# Patient Record
Sex: Male | Born: 1975 | Race: White | Hispanic: No | Marital: Married | Smoking: Never smoker
Health system: Southern US, Community
[De-identification: ages and names within clinical notes are randomized; demographics above are authoritative.]

## PROBLEM LIST (undated history)

## (undated) DIAGNOSIS — R112 Nausea with vomiting, unspecified: Secondary | ICD-10-CM

## (undated) DIAGNOSIS — K219 Gastro-esophageal reflux disease without esophagitis: Secondary | ICD-10-CM

## (undated) DIAGNOSIS — I341 Nonrheumatic mitral (valve) prolapse: Secondary | ICD-10-CM

## (undated) DIAGNOSIS — N201 Calculus of ureter: Secondary | ICD-10-CM

## (undated) DIAGNOSIS — Z973 Presence of spectacles and contact lenses: Secondary | ICD-10-CM

## (undated) DIAGNOSIS — Z87442 Personal history of urinary calculi: Secondary | ICD-10-CM

## (undated) DIAGNOSIS — Z9889 Other specified postprocedural states: Secondary | ICD-10-CM

## (undated) HISTORY — PX: URETEROSCOPY WITH HOLMIUM LASER LITHOTRIPSY: SHX6645

## (undated) HISTORY — PX: OTHER SURGICAL HISTORY: SHX169

## (undated) HISTORY — PX: EXTRACORPOREAL SHOCK WAVE LITHOTRIPSY: SHX1557

## (undated) HISTORY — PX: ORIF FOOT FRACTURE: SHX2123

## (undated) HISTORY — PX: CYSTOSCOPY: SUR368

---

## 1996-08-30 HISTORY — PX: KNEE ARTHROSCOPY W/ MENISCAL REPAIR: SHX1877

## 2019-07-31 HISTORY — PX: EXTRACORPOREAL SHOCK WAVE LITHOTRIPSY: SHX1557

## 2019-11-16 ENCOUNTER — Other Ambulatory Visit: Payer: Self-pay

## 2019-11-16 ENCOUNTER — Encounter (HOSPITAL_BASED_OUTPATIENT_CLINIC_OR_DEPARTMENT_OTHER): Payer: Self-pay | Admitting: Urology

## 2019-11-16 ENCOUNTER — Other Ambulatory Visit: Payer: Self-pay | Admitting: Urology

## 2019-11-16 NOTE — Progress Notes (Signed)
Spoke w/ via phone for pre-op interview---[facility] needs dos----  none           COVID test ------11-17-2019 900 Arrive at -------900 am 11-21-2019 NPO after ------midnight Medications to take morning of surgery -----tamsulosin Diabetic medication -----n/a Patient Special Instructions -----none Pre-Op special Istructions -----none Patient verbalized understanding of instructions that were given at this phone interview. Patient denies shortness of breath, chest pain, fever, cough a this phone interview.

## 2019-11-17 ENCOUNTER — Other Ambulatory Visit (HOSPITAL_COMMUNITY)
Admission: RE | Admit: 2019-11-17 | Discharge: 2019-11-17 | Disposition: A | Discharge status: Home / Self Care | Payer: BC Managed Care – PPO | Source: Ambulatory Visit | Attending: Urology | Admitting: Urology

## 2019-11-17 DIAGNOSIS — Z20822 Contact with and (suspected) exposure to covid-19: Secondary | ICD-10-CM | POA: Insufficient documentation

## 2019-11-17 DIAGNOSIS — Z01812 Encounter for preprocedural laboratory examination: Secondary | ICD-10-CM | POA: Diagnosis not present

## 2019-11-17 LAB — SARS CORONAVIRUS 2 (TAT 6-24 HRS): SARS Coronavirus 2: NEGATIVE

## 2019-11-21 ENCOUNTER — Ambulatory Visit (HOSPITAL_BASED_OUTPATIENT_CLINIC_OR_DEPARTMENT_OTHER)
Admission: RE | Admit: 2019-11-21 | Discharge: 2019-11-21 | Disposition: A | Discharge status: Home / Self Care | Payer: BC Managed Care – PPO | Attending: Urology | Admitting: Urology

## 2019-11-21 ENCOUNTER — Encounter (HOSPITAL_BASED_OUTPATIENT_CLINIC_OR_DEPARTMENT_OTHER)
Admission: RE | Disposition: A | Discharge status: Home / Self Care | Payer: Self-pay | Source: Home / Self Care | Attending: Urology

## 2019-11-21 ENCOUNTER — Ambulatory Visit (HOSPITAL_BASED_OUTPATIENT_CLINIC_OR_DEPARTMENT_OTHER): Payer: BC Managed Care – PPO | Admitting: Certified Registered"

## 2019-11-21 ENCOUNTER — Encounter (HOSPITAL_BASED_OUTPATIENT_CLINIC_OR_DEPARTMENT_OTHER): Payer: Self-pay | Admitting: Urology

## 2019-11-21 DIAGNOSIS — N202 Calculus of kidney with calculus of ureter: Secondary | ICD-10-CM | POA: Diagnosis not present

## 2019-11-21 DIAGNOSIS — Q6210 Congenital occlusion of ureter, unspecified: Secondary | ICD-10-CM | POA: Diagnosis not present

## 2019-11-21 DIAGNOSIS — Z87442 Personal history of urinary calculi: Secondary | ICD-10-CM | POA: Insufficient documentation

## 2019-11-21 HISTORY — DX: Other specified postprocedural states: Z98.890

## 2019-11-21 HISTORY — DX: Personal history of urinary calculi: Z87.442

## 2019-11-21 HISTORY — DX: Nausea with vomiting, unspecified: R11.2

## 2019-11-21 HISTORY — DX: Nonrheumatic mitral (valve) prolapse: I34.1

## 2019-11-21 HISTORY — DX: Gastro-esophageal reflux disease without esophagitis: K21.9

## 2019-11-21 HISTORY — PX: CYSTOSCOPY WITH RETROGRADE PYELOGRAM, URETEROSCOPY AND STENT PLACEMENT: SHX5789

## 2019-11-21 SURGERY — CYSTOURETEROSCOPY, WITH RETROGRADE PYELOGRAM AND STENT INSERTION
Anesthesia: General | Site: Pelvis | Laterality: Bilateral

## 2019-11-21 MED ORDER — DEXAMETHASONE SODIUM PHOSPHATE 10 MG/ML IJ SOLN
INTRAMUSCULAR | Status: DC | PRN
  Administered 2019-11-21 (×2): 5 mg via INTRAVENOUS

## 2019-11-21 MED ORDER — OXYBUTYNIN CHLORIDE 5 MG PO TABS: 5.0000 mg | ORAL_TABLET | Freq: Three times a day (TID) | ORAL | 1 refills | Status: DC | PRN

## 2019-11-21 MED ORDER — PROPOFOL 10 MG/ML IV BOLUS
INTRAVENOUS | Status: AC
  Filled 2019-11-21: qty 40

## 2019-11-21 MED ORDER — OXYCODONE HCL 5 MG/5ML PO SOLN
5.0000 mg | Freq: Once | ORAL | Status: AC | PRN
  Filled 2019-11-21: qty 5

## 2019-11-21 MED ORDER — CEFAZOLIN SODIUM-DEXTROSE 2-4 GM/100ML-% IV SOLN
2.0000 g | INTRAVENOUS | Status: AC
  Administered 2019-11-21: 2 g via INTRAVENOUS
  Filled 2019-11-21: qty 100

## 2019-11-21 MED ORDER — IOHEXOL 300 MG/ML  SOLN
INTRAMUSCULAR | Status: DC | PRN
  Administered 2019-11-21: 40 mL via URETHRAL

## 2019-11-21 MED ORDER — MIDAZOLAM HCL 2 MG/2ML IJ SOLN
INTRAMUSCULAR | Status: DC | PRN
  Administered 2019-11-21: 2 mg via INTRAVENOUS

## 2019-11-21 MED ORDER — KETOROLAC TROMETHAMINE 30 MG/ML IJ SOLN
INTRAMUSCULAR | Status: DC | PRN
  Administered 2019-11-21: 30 mg via INTRAVENOUS

## 2019-11-21 MED ORDER — LIDOCAINE 2% (20 MG/ML) 5 ML SYRINGE
INTRAMUSCULAR | Status: AC
  Filled 2019-11-21: qty 5

## 2019-11-21 MED ORDER — MIDAZOLAM HCL 2 MG/2ML IJ SOLN
INTRAMUSCULAR | Status: AC
  Filled 2019-11-21: qty 2

## 2019-11-21 MED ORDER — SODIUM CHLORIDE 0.9 % IR SOLN
Status: DC | PRN
  Administered 2019-11-21: 3000 mL via INTRAVESICAL

## 2019-11-21 MED ORDER — FENTANYL CITRATE (PF) 100 MCG/2ML IJ SOLN
INTRAMUSCULAR | Status: AC
  Filled 2019-11-21: qty 2

## 2019-11-21 MED ORDER — HYDROCODONE-ACETAMINOPHEN 5-325 MG PO TABS: 1.0000 | ORAL_TABLET | ORAL | 0 refills | Status: DC | PRN

## 2019-11-21 MED ORDER — ONDANSETRON HCL 4 MG/2ML IJ SOLN
4.0000 mg | Freq: Once | INTRAMUSCULAR | Status: DC | PRN
  Filled 2019-11-21: qty 2

## 2019-11-21 MED ORDER — LACTATED RINGERS IV SOLN
INTRAVENOUS | Status: DC
  Filled 2019-11-21: qty 1000

## 2019-11-21 MED ORDER — CEFAZOLIN SODIUM-DEXTROSE 2-4 GM/100ML-% IV SOLN
INTRAVENOUS | Status: AC
  Filled 2019-11-21: qty 100

## 2019-11-21 MED ORDER — OXYCODONE HCL 5 MG PO TABS
5.0000 mg | ORAL_TABLET | Freq: Once | ORAL | Status: AC | PRN
  Administered 2019-11-21: 12:00:00 5 mg via ORAL
  Filled 2019-11-21: qty 1

## 2019-11-21 MED ORDER — DEXAMETHASONE SODIUM PHOSPHATE 10 MG/ML IJ SOLN
INTRAMUSCULAR | Status: AC
  Filled 2019-11-21: qty 1

## 2019-11-21 MED ORDER — ONDANSETRON HCL 4 MG/2ML IJ SOLN
INTRAMUSCULAR | Status: DC | PRN
  Administered 2019-11-21: 4 mg via INTRAVENOUS

## 2019-11-21 MED ORDER — PROPOFOL 10 MG/ML IV BOLUS
INTRAVENOUS | Status: DC | PRN
  Administered 2019-11-21: 200 mg via INTRAVENOUS

## 2019-11-21 MED ORDER — KETOROLAC TROMETHAMINE 30 MG/ML IJ SOLN
INTRAMUSCULAR | Status: AC
  Filled 2019-11-21: qty 1

## 2019-11-21 MED ORDER — FENTANYL CITRATE (PF) 100 MCG/2ML IJ SOLN
25.0000 ug | INTRAMUSCULAR | Status: DC | PRN
  Filled 2019-11-21: qty 1

## 2019-11-21 MED ORDER — PROPOFOL 10 MG/ML IV BOLUS
INTRAVENOUS | Status: AC
  Filled 2019-11-21: qty 20

## 2019-11-21 MED ORDER — FENTANYL CITRATE (PF) 100 MCG/2ML IJ SOLN
INTRAMUSCULAR | Status: DC | PRN
  Administered 2019-11-21: 50 ug via INTRAVENOUS
  Administered 2019-11-21: 25 ug via INTRAVENOUS
  Administered 2019-11-21 (×2): 50 ug via INTRAVENOUS
  Administered 2019-11-21: 25 ug via INTRAVENOUS

## 2019-11-21 MED ORDER — PHENAZOPYRIDINE HCL 200 MG PO TABS: 200.0000 mg | ORAL_TABLET | Freq: Three times a day (TID) | ORAL | 0 refills | Status: DC | PRN

## 2019-11-21 MED ORDER — SCOPOLAMINE 1 MG/3DAYS TD PT72
MEDICATED_PATCH | TRANSDERMAL | Status: DC | PRN
  Administered 2019-11-21: 1 via TRANSDERMAL

## 2019-11-21 MED ORDER — TAMSULOSIN HCL 0.4 MG PO CAPS: 0.4000 mg | ORAL_CAPSULE | Freq: Every day | ORAL | 0 refills | Status: DC

## 2019-11-21 MED ORDER — SCOPOLAMINE 1 MG/3DAYS TD PT72
MEDICATED_PATCH | TRANSDERMAL | Status: AC
  Filled 2019-11-21: qty 1

## 2019-11-21 MED ORDER — LIDOCAINE 2% (20 MG/ML) 5 ML SYRINGE
INTRAMUSCULAR | Status: DC | PRN
  Administered 2019-11-21: 100 mg via INTRAVENOUS

## 2019-11-21 MED ORDER — ONDANSETRON HCL 4 MG/2ML IJ SOLN
INTRAMUSCULAR | Status: AC
  Filled 2019-11-21: qty 2

## 2019-11-21 MED ORDER — OXYCODONE HCL 5 MG PO TABS
ORAL_TABLET | ORAL | Status: AC
  Filled 2019-11-21: qty 1

## 2019-11-21 SURGICAL SUPPLY — 23 items
BAG DRAIN URO-CYSTO SKYTR STRL (DRAIN) ×2 IMPLANT
BASKET STONE 1.7 NGAGE (UROLOGICAL SUPPLIES) ×1 IMPLANT
BASKET ZERO TIP NITINOL 2.4FR (BASKET) ×1 IMPLANT
BENZOIN TINCTURE PRP APPL 2/3 (GAUZE/BANDAGES/DRESSINGS) ×1 IMPLANT
CATH URET 5FR 28IN OPEN ENDED (CATHETERS) ×1 IMPLANT
CLOTH BEACON ORANGE TIMEOUT ST (SAFETY) ×1 IMPLANT
FIBER LASER FLEXIVA 365 (UROLOGICAL SUPPLIES) IMPLANT
FIBER LASER TRAC TIP (UROLOGICAL SUPPLIES) ×1 IMPLANT
GLOVE BIO SURGEON STRL SZ7.5 (GLOVE) ×2 IMPLANT
GOWN STRL REUS W/TWL XL LVL3 (GOWN DISPOSABLE) ×2 IMPLANT
GUIDEWIRE STR DUAL SENSOR (WIRE) ×1 IMPLANT
GUIDEWIRE ZIPWRE .038 STRAIGHT (WIRE) ×2 IMPLANT
IV NS IRRIG 3000ML ARTHROMATIC (IV SOLUTION) ×2 IMPLANT
KIT TURNOVER CYSTO (KITS) ×2 IMPLANT
MANIFOLD NEPTUNE II (INSTRUMENTS) ×2 IMPLANT
NS IRRIG 500ML POUR BTL (IV SOLUTION) ×2 IMPLANT
PACK CYSTO (CUSTOM PROCEDURE TRAY) ×2 IMPLANT
SHEATH URETERAL 12FRX35CM (MISCELLANEOUS) ×1 IMPLANT
STENT URET 6FRX26 CONTOUR (STENTS) ×2 IMPLANT
STRIP CLOSURE SKIN 1/2X4 (GAUZE/BANDAGES/DRESSINGS) ×1 IMPLANT
SYR 10ML LL (SYRINGE) ×2 IMPLANT
TUBE CONNECTING 12X1/4 (SUCTIONS) ×1 IMPLANT
TUBING UROLOGY SET (TUBING) ×2 IMPLANT

## 2019-11-21 NOTE — Anesthesia Preprocedure Evaluation (Signed)
Anesthesia Evaluation  Patient identified by MRN, date of birth, ID band Patient awake    Reviewed: Allergy & Precautions, NPO status , Patient's Chart, lab work & pertinent test results  History of Anesthesia Complications (+) PONVNegative for: history of anesthetic complications  Airway Mallampati: III  TM Distance: >3 FB Neck ROM: Full    Dental  (+) Teeth Intact, Caps   Pulmonary neg pulmonary ROS,    Pulmonary exam normal        Cardiovascular negative cardio ROS Normal cardiovascular exam     Neuro/Psych negative neurological ROS  negative psych ROS   GI/Hepatic Neg liver ROS, GERD  ,  Endo/Other  negative endocrine ROS  Renal/GU Renal disease (nephrolithiasis)  negative genitourinary   Musculoskeletal negative musculoskeletal ROS (+)   Abdominal   Peds  Hematology negative hematology ROS (+)   Anesthesia Other Findings   Reproductive/Obstetrics                            Anesthesia Physical Anesthesia Plan  ASA: II  Anesthesia Plan: General   Post-op Pain Management:    Induction: Intravenous  PONV Risk Score and Plan: 3 and Ondansetron, Dexamethasone, Midazolam and Treatment may vary due to age or medical condition  Airway Management Planned: LMA  Additional Equipment: None  Intra-op Plan:   Post-operative Plan: Extubation in OR  Informed Consent: I have reviewed the patients History and Physical, chart, labs and discussed the procedure including the risks, benefits and alternatives for the proposed anesthesia with the patient or authorized representative who has indicated his/her understanding and acceptance.     Dental advisory given  Plan Discussed with:   Anesthesia Plan Comments:        Anesthesia Quick Evaluation

## 2019-11-21 NOTE — H&P (Signed)
Andres Thompson  MRN: [id number]  DOB: 20-Oct-1975, 44 year old Male  SSN:    PRIMARY CARE:  Noni Saupe, MD  REFERRING:    PROVIDER:  Rhoderick Moody, M.D.  LOCATION:  [facility] [phone]     --------------------------------------------------------------------------------   CC: I have pain in the flank.  HPI: Andres Thompson is a 44 year-old male patient who is here for flank pain.  The problem is on both sides. His pain started about approximately 11/07/2019. The pain is sharp. The intensity of his pain is rated as a 10. The pain is constant. The pain does radiate.   Lying down< makes the pain better. Sitting makes the pain worse. He was treated with the following pain medication(s): Hydrocodone.   He has had this same pain previously. He has had kidney stones.   -CTSS from 11/08/19 showed two left distal ureteral stones along with bilateral renal stones measuring up to ~5 mm.  -He brought a small stone with him today that he passed on 2022-12-27  -Followed by Dr. Salvatore Decent in [place]  -s/p right ESWL in Dec. 2020 with subsequent ureteroscopy (x5) due to residual stone burden  -No prior history of kidney stones until 2020  -No prior history of IBS, Crohn's or prior intestinal diversion  -Denies N/V/F/C and is AFVSS today.     ALLERGIES: None   MEDICATIONS: None   GU PSH: Cysto Uretero Lithotripsy       PSH Notes: L foot Fx repair with hardware  L knee Meniscus repair    NON-GU PSH: None   GU PMH: None   NON-GU PMH: Cardiac murmur, unspecified GERD Phlebitis and thrombophlebitis of unspecified deep vessels of unspecified lower extremity    FAMILY HISTORY: 1 Daughter - No Family History 1 son - No Family History   SOCIAL HISTORY: Marital Status: Married Preferred Language: English; Race: White Current Smoking Status: Patient has never smoked.   Tobacco Use Assessment Completed: Used Tobacco in last 30 days? Has never drank.  Drinks 1  caffeinated drink per day.    REVIEW OF SYSTEMS:    GU Review Male:   Patient denies frequent urination, hard to postpone urination, burning/ pain with urination, get up at night to urinate, leakage of urine, stream starts and stops, trouble starting your stream, have to strain to urinate , erection problems, and penile pain.  Gastrointestinal (Upper):   Patient denies nausea, vomiting, and indigestion/ heartburn.  Gastrointestinal (Lower):   Patient denies diarrhea and constipation.  Constitutional:   Patient denies fever, night sweats, weight loss, and fatigue.  Skin:   Patient denies skin rash/ lesion and itching.  Eyes:   Patient denies blurred vision and double vision.  Ears/ Nose/ Throat:   Patient denies sore throat and sinus problems.  Hematologic/Lymphatic:   Patient denies easy bruising and swollen glands.  Cardiovascular:   Patient denies leg swelling and chest pains.  Respiratory:   Patient denies cough and shortness of breath.  Endocrine:   Patient denies excessive thirst.  Musculoskeletal:   Patient reports back pain. Patient denies joint pain.  Neurological:   Patient denies headaches and dizziness.  Psychologic:   Patient denies depression and anxiety.   Notes: history of kidney stones , B flank pain . last 14 month is when the kidney stones began.     VITAL SIGNS:      11/14/2019 08:41 AM  Weight 250 lb / 113.4 kg  Height 79 in / 200.66 cm  BP 127/84 mmHg  Heart Rate 73 /min  Temperature 98.2 F / 36.7 C  BMI 28.2 kg/m   MULTI-SYSTEM PHYSICAL EXAMINATION:    Constitutional: Well-nourished. No physical deformities. Normally developed. Good grooming.  Neurologic / Psychiatric: Oriented to time, oriented to place, oriented to person. No depression, no anxiety, no agitation.  Musculoskeletal: Normal gait and station of head and neck.     PAST DATA REVIEWED:  Source Of History:  Patient  X-Ray Review: Outside CT: Reviewed Films. Reviewed Report. Discussed With  Patient.     PROCEDURES:         KUB - [id number]  A single view of the abdomen is obtained.      . Patient confirmed No Neulasta OnPro Device.   There are bilateral calcifications in each renal shadow measuring           Renal Ultrasound - 16967  Right Kidney: Length:13.0 cm Depth: 6.4 cm Cortical Width:1.1 cm Width:6.3 cm  Left Kidney: Length:11.1 cm Depth:6.2 cm Cortical Width: 1.4 cm Width:5.3 cm  Left Kidney/Ureter:  There are 2 stones noted largest midpole 7.1 mm; there is very mild dilatation of collecting system.  Right Kidney/Ureter:  There are 3 probable stones demonstrated today w largest mid pole 5.7 mm-other 2 are less then 5 mm. There is a question of very mild dilatation.  Bladder:  bladder volume 126.29 w both jets demonstrated      . Patient confirmed No Neulasta OnPro Device.   No hydronephrosis seen, bilaterally. Stone shadowing noted within both kidneys as noted above. No solid parenchymal lesions observed, bilaterally. There is normal renal echogenecity bilaterally. The bladder lumen has a smooth contour with no masses or debris.          Urinalysis - 81003 Dipstick Dipstick Cont'd Micro  Specimen: Voided Bilirubin: Neg WBC/hpf: 0 - 5/hpf  Color: Yellow Ketones: Neg RBC/hpf: 10 - 20/hpf  Appearance: Clear Blood: 3+ Bacteria: Rare  Specific Gravity: 1.025 Protein: Neg Cystals: NS (Not Seen)  pH: 6.0 Urobilinogen: 0.2 Casts: NS (Not Seen)  Glucose: Neg Nitrites: Neg Trichomonas: Not Present    Leukocyte Esterase: Neg Mucous: Present      Epithelial Cells: 0 - 5/hpf      Yeast: NS (Not Seen)      Sperm: Not Present    Notes:      ASSESSMENT:      ICD-10 Details  1 GU:   Flank Pain - E93.81 Acute, Uncomplicated  2   Ureteral calculus - N20.1    PLAN:            Medications New Meds: Hydrocodone-Acetaminophen 5 mg-325 mg tablet 1 tablet PO Q 4 H PRN   #20  0 Refill(s)  Keflex 500 mg capsule 1 capsule PO BID   #14  0 Refill(s)             Orders Labs Urine Culture, Stone Analysis  X-Rays: KUB    Renal Ultrasound          Schedule Return Visit/Planned Activity: ASAP - Schedule Surgery          Document Letter(s):  Created for Patient: Clinical Summary         Notes:   -RUS today was negative for any appreciable hydronephrosis. Both KUB and RUS show bilateral renal stones  -The risks, benefits and alternatives of cystoscopy with BILATERAL ureteroscopy, laser lithotripsy and ureteral stent placement was discussed the patient. Risks included, but are not limited to: bleeding, urinary tract  infection, ureteral injury/avulsion, ureteral stricture formation, retained stone fragments, the possibility that multiple surgeries may be required to treat the stone(s), MI, stroke, PE and the inherent risks of general anesthesia. The patient voices understanding and wishes to proceed.  - We discussed criteria to return to clinic or proceed to the ER which include: Fever/chills, worsening pain, nausea/vomiting and/or persistent gross hematuria.

## 2019-11-21 NOTE — Transfer of Care (Signed)
Immediate Anesthesia Transfer of Care Note  Patient: Andres Thompson  Procedure(s) Performed: Procedure(s) (LRB): CYSTOSCOPY WITH RETROGRADE PYELOGRAM, URETEROSCOPY WITH HOLMIUM LASERAND STENT PLACEMENT (Bilateral)  Patient Location: PACU  Anesthesia Type: General  Level of Consciousness: awake, oriented, sedated and patient cooperative  Airway & Oxygen Therapy: Patient Spontanous Breathing and Patient connected to face mask oxygen  Post-op Assessment: Report given to PACU RN and Post -op Vital signs reviewed and stable  Post vital signs: Reviewed and stable  Complications: No apparent anesthesia complications  Last Vitals:  Vitals Value Taken Time  BP    Temp    Pulse 63 11/21/19 1100  Resp 8 11/21/19 1100  SpO2 98 % 11/21/19 1100  Vitals shown include unvalidated device data.  Last Pain:  Vitals:   11/21/19 0905  TempSrc: Oral  PainSc: 7       Patients Stated Pain Goal: 5 (11/21/19 0905)

## 2019-11-21 NOTE — Op Note (Signed)
Operative Note  Preoperative diagnosis:  1.  Bilateral renal stones and multiple left distal ureteral stones  Postoperative diagnosis: 1.  Nonobstructing right renal stones 2.  Obstructing proximal left ureteral stones (2) measuring approximately 5 mm each and multiple nonobstructing left renal stones measuring approximately 2 to 3 mm each 3.  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  Cystoscopy with bilateral ureteroscopy, bilateral holmium laser lithotripsy and bilateral JJ stent placement 2.  Bilateral retrograde pyelograms with intraoperative interpretation of fluoroscopic imaging  Surgeon: Rhoderick Moody, MD  Assistants:  None  Anesthesia:  General  Complications:  None  EBL: Less than 5 mL  Specimens: 1.  Left ureteral stones  Drains/Catheters: 1.  Bilateral 6 French, 26 cm JJ stents with tethers  Intraoperative findings:   1. Solitary right collecting system with no filling defects or dilation involving the right ureter or right renal pelvis seen on retrograde pyelogram 2. Solitary left collecting system with a filling defect in the proximal aspects of the left ureter, consistent with obstructing stones.  There were no filling defects or dilation seen within the left renal pelvis 3.   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 male with a 2-week history of worsening left-sided flank pain and a history of kidney stones.  He had a CT stone study on 11/08/2019 that revealed bilateral nonobstructing renal calculi as well as multiple left ureteral calculi.  He has been consented for the above procedures, voices understanding and wishes to proceed.  Description of procedure:  After informed consent was obtained, the patient was brought to the operating room and general LMA anesthesia was administered. The patient was then placed in the dorsolithotomy position and prepped and draped in the usual sterile  fashion. A timeout was performed. A 23 French rigid cystoscope was then inserted into the urethral meatus and advanced into the bladder under direct vision. A complete bladder survey revealed no intravesical pathology.  A 5 French ureteral catheter was then inserted into the right ureteral orifice and a retrograde pyelogram was obtained, with the findings listed above.  A Glidewire was then used to intubate the lumen of the ureteral catheter and was advanced up to the right renal pelvis, under fluoroscopic guidance.  The catheter was then removed, leaving the wire in place.  A flexible ureteroscope was then advanced alongside the Glidewire and up to the right renal pelvis.  A complete inspection of the right renal pelvis and its associated calyces revealed 2 small 2 to 3 mm stones.  A 200 m holmium laser was then used to dust the stones.  The flexible ureteroscope was then removed under direct vision, leaving the wire in place.  A 6 French, 26 cm JJ stent was then placed over the wire and into good position within the right collecting system, confirming placement via fluoroscopy.  The tether the stent was left intact.  The rigid cystoscope was then reinserted into the urethral meatus and advanced into the bladder.  A 5 French ureteral catheter was then inserted into the left ureteral orifice and a retrograde pyelogram was obtained, with the findings listed above.  A Glidewire was then used to intubate the lumen of the ureteral catheter and was advanced up to the left renal pelvis, under fluoroscopic guidance.  The catheter was then removed, leaving the wire in place.  An additional sensor wire was then advanced in a similar fashion.  A  14 French, 35 cm ureteral access sheath was then advanced over the sensor wire and into the proximal aspects of the left ureter.  A flexible ureteroscope was advanced through the access sheath, immediately identifying 2 obstructing 5 mm proximal stones.  The 200 m holmium  laser was then used to fracture the stones.  An engage basket was then used to extract the stone fragments from the lumen of the left ureter.  The flexible ureteroscope was then advanced up to the renal pelvis were multiple to 3 mm stones were identified.  The holmium laser was then used to dust the stones.  The flexible ureteroscope and ureteral access sheath were then removed under direct vision, revealing no ureteral trauma or residual stone burden.  A 6 French, 26 cm JJ stent was then placed over the Glidewire and into good position within the left collecting system, confirming placement via fluoroscopy.  The tether the stent was left intact.  The patient's bladder was drained.  He tolerated the procedure well and was transferred to the postanesthesia in stable condition.  Plan: The patient has been instructed to remove his stents at 6 AM on 11/26/2019.  He will follow-up in 6 weeks with a renal ultrasound and metabolic stone work-up.

## 2019-11-21 NOTE — Anesthesia Procedure Notes (Signed)
Procedure Name: LMA Insertion Date/Time: 11/21/2019 9:50 AM Performed by: Francie Massing, CRNA Pre-anesthesia Checklist: Patient identified, Emergency Drugs available, Suction available and Patient being monitored Patient Re-evaluated:Patient Re-evaluated prior to induction Oxygen Delivery Method: Circle system utilized Preoxygenation: Pre-oxygenation with 100% oxygen Induction Type: IV induction Ventilation: Mask ventilation without difficulty LMA: LMA inserted LMA Size: 4.0 Number of attempts: 1 Airway Equipment and Method: Bite block Placement Confirmation: positive ETCO2 Tube secured with: Tape Dental Injury: Teeth and Oropharynx as per pre-operative assessment

## 2019-11-21 NOTE — Discharge Instructions (Signed)
[facility] [phone] Post Ureteroscopy With or Without Stent Instructions  Definitions:  Ureter: The duct that transports urine from the kidney to the bladder. Stent:   A plastic hollow tube that is placed into the ureter, from the kidney to the                 bladder to prevent the ureter from swelling shut.  GENERAL INSTRUCTIONS:  Despite the fact that no skin incisions were used, the area around the ureter and bladder is raw and irritated. The stent is a foreign body which will further irritate the bladder wall. This irritation is manifested by increased frequency of urination, both day and night, and by an increase in the urge to urinate. In some, the urge to urinate is present almost always. Sometimes the urge is strong enough that you may not be able to stop yourself from urinating. The only real cure is to remove the stent and then give time for the bladder wall to heal which can't be done until the danger of the ureter swelling shut has passed, which varies.  You may see some blood in your urine while the stent is in place and a few days afterwards. Do not be alarmed, even if the urine was clear for a while. Get off your feet and drink lots of fluids until clearing occurs. If you start to pass clots or don't improve, call us.  DIET: You may return to your normal diet immediately. Because of the raw surface of your bladder, alcohol, spicy foods, acid type foods and drinks with caffeine may cause irritation or frequency and should be used in moderation. To keep your urine flowing freely and to avoid constipation, drink plenty of fluids during the day ( 8-10 glasses ). Tip: Avoid cranberry juice because it is very acidic.  ACTIVITY: Your physical activity doesn't need to be restricted. However, if you are very active, you may see some blood in your urine. We suggest that you reduce your activity under these circumstances until the bleeding has stopped.  BOWELS: It is  important to keep your bowels regular during the postoperative period. Straining with bowel movements can cause bleeding. A bowel movement every other day is reasonable. Use a mild laxative if needed, such as Milk of Magnesia 2-3 tablespoons, or 2 Dulcolax tablets. Call if you continue to have problems. If you have been taking narcotics for pain, before, during or after your surgery, you may be constipated. Take a laxative if necessary.   MEDICATION: You should resume your pre-surgery medications unless told not to. In addition you will often be given an antibiotic to prevent infection. These should be taken as prescribed until the bottles are finished unless you are having an unusual reaction to one of the drugs.  PROBLEMS YOU SHOULD REPORT TO [place]:  Fevers over 100.5 Fahrenheit.  Heavy bleeding, or clots ( See above notes about blood in urine ).  Inability to urinate.  Drug reactions ( hives, rash, nausea, vomiting, diarrhea ).  Severe burning or pain with urination that is not improving.  FOLLOW-UP: You will need a follow-up appointment to monitor your progress. Call for this appointment at the number listed above. Usually the first appointment will be about three to fourteen days after your surgery.      Post Anesthesia Home Care Instructions  Activity: Get plenty of rest for the remainder of the day. A responsible adult should stay with you for 24 hours following the procedure.  For the next 24 hours, DO NOT: -Drive a car -Advertising copywriter -Drink alcoholic beverages -Take any medication unless instructed by your physician -Make any legal decisions or sign important papers.  Meals: Start with liquid foods such as gelatin or soup. Progress to regular foods as tolerated. Avoid greasy, spicy, heavy foods. If nausea and/or vomiting occur, drink only clear liquids until the nausea and/or vomiting subsides. Call your physician if vomiting continues.  Special  Instructions/Symptoms: Your throat may feel dry or sore from the anesthesia or the breathing tube placed in your throat during surgery. If this causes discomfort, gargle with warm salt water. The discomfort should disappear within 24 hours.  If you had a scopolamine patch placed behind your ear for the management of post- operative nausea and/or vomiting:  1. The medication in the patch is effective for 72 hours, after which it should be removed.  Wrap patch in a tissue and discard in the trash. Wash hands thoroughly with soap and water. 2. You may remove the patch earlier than 72 hours if you experience unpleasant side effects which may include dry mouth, dizziness or visual disturbances. 3. Avoid touching the patch. Wash your hands with soap and water after contact with the patch.   Remove stents on 11-26-2019 at 6 am

## 2019-11-23 NOTE — Anesthesia Postprocedure Evaluation (Signed)
Anesthesia Post Note  Patient: Andres Thompson  Procedure(s) Performed: CYSTOSCOPY WITH RETROGRADE PYELOGRAM, URETEROSCOPY WITH HOLMIUM LASERAND STENT PLACEMENT (Bilateral Pelvis)     Patient location during evaluation: PACU Anesthesia Type: General Level of consciousness: awake Pain management: pain level controlled Vital Signs Assessment: post-procedure vital signs reviewed and stable Respiratory status: spontaneous breathing Cardiovascular status: stable Postop Assessment: no apparent nausea or vomiting Anesthetic complications: no    Last Vitals:  Vitals:   11/21/19 1130 11/21/19 1220  BP: (!) 150/71 139/83  Pulse: (!) 57 (!) 55  Resp: 17 16  Temp:  36.5 C  SpO2: 100% 100%    Last Pain:  Vitals:   11/22/19 1003  TempSrc:   PainSc: 5    Pain Goal: Patients Stated Pain Goal: 5 (11/21/19 1212)                 Caren Macadam

## 2020-02-21 ENCOUNTER — Other Ambulatory Visit: Payer: Self-pay | Admitting: Urology

## 2020-02-22 NOTE — Progress Notes (Signed)
Spoke with nurse from [facility] pre-op instructions given to patient and he will pick up blue folder.  He is to arrive at 1:30.

## 2020-02-25 ENCOUNTER — Encounter (HOSPITAL_BASED_OUTPATIENT_CLINIC_OR_DEPARTMENT_OTHER)
Admission: RE | Disposition: A | Discharge status: Home / Self Care | Payer: Self-pay | Source: Home / Self Care | Attending: Urology

## 2020-02-25 ENCOUNTER — Ambulatory Visit (HOSPITAL_COMMUNITY): Payer: BC Managed Care – PPO

## 2020-02-25 ENCOUNTER — Encounter (HOSPITAL_BASED_OUTPATIENT_CLINIC_OR_DEPARTMENT_OTHER): Payer: Self-pay | Admitting: Urology

## 2020-02-25 ENCOUNTER — Ambulatory Visit (HOSPITAL_BASED_OUTPATIENT_CLINIC_OR_DEPARTMENT_OTHER)
Admission: RE | Admit: 2020-02-25 | Discharge: 2020-02-25 | Disposition: A | Discharge status: Home / Self Care | Payer: BC Managed Care – PPO | Attending: Urology | Admitting: Urology

## 2020-02-25 DIAGNOSIS — N201 Calculus of ureter: Secondary | ICD-10-CM

## 2020-02-25 DIAGNOSIS — Z79899 Other long term (current) drug therapy: Secondary | ICD-10-CM | POA: Insufficient documentation

## 2020-02-25 DIAGNOSIS — N202 Calculus of kidney with calculus of ureter: Secondary | ICD-10-CM | POA: Diagnosis not present

## 2020-02-25 HISTORY — PX: EXTRACORPOREAL SHOCK WAVE LITHOTRIPSY: SHX1557

## 2020-02-25 SURGERY — LITHOTRIPSY, ESWL
Anesthesia: LOCAL | Laterality: Left

## 2020-02-25 MED ORDER — DIPHENHYDRAMINE HCL 25 MG PO CAPS
25.0000 mg | ORAL_CAPSULE | ORAL | Status: AC
  Administered 2020-02-25: 25 mg via ORAL

## 2020-02-25 MED ORDER — DIAZEPAM 5 MG PO TABS
ORAL_TABLET | ORAL | Status: AC
  Filled 2020-02-25: qty 2

## 2020-02-25 MED ORDER — SODIUM CHLORIDE 0.9% FLUSH: 3.0000 mL | Freq: Two times a day (BID) | INTRAVENOUS | Status: DC

## 2020-02-25 MED ORDER — CIPROFLOXACIN HCL 500 MG PO TABS
500.0000 mg | ORAL_TABLET | ORAL | Status: AC
  Administered 2020-02-25: 500 mg via ORAL

## 2020-02-25 MED ORDER — SODIUM CHLORIDE 0.9 % IV SOLN: INTRAVENOUS | Status: DC

## 2020-02-25 MED ORDER — DIAZEPAM 5 MG PO TABS
10.0000 mg | ORAL_TABLET | ORAL | Status: AC
  Administered 2020-02-25: 10 mg via ORAL

## 2020-02-25 MED ORDER — DIPHENHYDRAMINE HCL 25 MG PO CAPS
ORAL_CAPSULE | ORAL | Status: AC
  Filled 2020-02-25: qty 1

## 2020-02-25 MED ORDER — CIPROFLOXACIN HCL 500 MG PO TABS
ORAL_TABLET | ORAL | Status: AC
  Filled 2020-02-25: qty 1

## 2020-02-25 NOTE — Discharge Instructions (Addendum)
Lithotripsy, Care After This sheet gives you information about how to care for yourself after your procedure. Your health care provider may also give you more specific instructions. If you have problems or questions, contact your health care provider. What can I expect after the procedure? After the procedure, it is common to have:  Some blood in your urine. This should only last for a few days.  Soreness in your back, sides, or upper abdomen for a few days.  Blotches or bruises on your back where the pressure wave entered the skin.  Pain, discomfort, or nausea when pieces (fragments) of the kidney [place] move through the tube that carries urine from the kidney to the bladder (ureter). [place] fragments may pass soon after the procedure, but they may continue to pass for up to 4-8 weeks. ? If you have severe pain or nausea, contact your health care provider. This may be caused by a large [place] that was not broken up, and this may mean that you need more treatment.  Some pain or discomfort during urination.  Some pain or discomfort in the lower abdomen or (in men) at the base of the penis. Follow these instructions at home: Medicines  Take over-the-counter and prescription medicines only as told by your health care provider.  If you were prescribed an antibiotic medicine, take it as told by your health care provider. Do not stop taking the antibiotic even if you start to feel better.  Do not drive for 24 hours if you were given a medicine to help you relax (sedative).  Do not drive or use heavy machinery while taking prescription pain medicine. Eating and drinking      Drink enough water and fluids to keep your urine clear or pale yellow. This helps any remaining pieces of the [place] to pass. It can also help prevent new stones from forming.  Eat plenty of fresh fruits and vegetables.  Follow instructions from your health care provider about eating and drinking restrictions. You may be  instructed: ? To reduce how much salt (sodium) you eat or drink. Check ingredients and nutrition facts on packaged foods and beverages. ? To reduce how much meat you eat.  Eat the recommended amount of calcium for your age and gender. Ask your health care provider how much calcium you should have. General instructions  Get plenty of rest.  Most people can resume normal activities 1-2 days after the procedure. Ask your health care provider what activities are safe for you.  Your health care provider may direct you to lie in a certain position (postural drainage) and tap firmly (percuss) over your kidney area to help [place] fragments pass. Follow instructions as told by your health care provider.  If directed, strain all urine through the strainer that was provided by your health care provider. ? Keep all fragments for your health care provider to see. Any stones that are found may be sent to a medical lab for examination. The [place] may be as small as a grain of salt.  Keep all follow-up visits as told by your health care provider. This is important. Contact a health care provider if:  You have pain that is severe or does not get better with medicine.  You have nausea that is severe or does not go away.  You have blood in your urine longer than your health care provider told you to expect.  You have more blood in your urine.  You have pain during urination that does   not go away.  You urinate more frequently than usual and this does not go away.  You develop a rash or any other possible signs of an allergic reaction. Get help right away if:  You have severe pain in your back, sides, or upper abdomen.  You have severe pain while urinating.  Your urine is very dark red.  You have blood in your stool (feces).  You cannot pass any urine at all.  You feel a strong urge to urinate after emptying your bladder.  You have a fever or chills.  You develop shortness of breath,  difficulty breathing, or chest pain.  You have severe nausea that leads to persistent vomiting.  You faint. Summary  After this procedure, it is common to have some pain, discomfort, or nausea when pieces (fragments) of the kidney stone move through the tube that carries urine from the kidney to the bladder (ureter). If this pain or nausea is severe, however, you should contact your health care provider.  Most people can resume normal activities 1-2 days after the procedure. Ask your health care provider what activities are safe for you.  Drink enough water and fluids to keep your urine clear or pale yellow. This helps any remaining pieces of the stone to pass, and it can help prevent new stones from forming.  If directed, strain your urine and keep all fragments for your health care provider to see. Fragments or stones may be as small as a grain of salt.  Get help right away if you have severe pain in your back, sides, or upper abdomen or have severe pain while urinating. This information is not intended to replace advice given to you by your health care provider. Make sure you discuss any questions you have with your health care provider. Document Revised: 11/27/2018 Document Reviewed: 07/07/2016 Elsevier Patient Education  2020 [employer].  Post Anesthesia Home Care Instructions  Activity: Get plenty of rest for the remainder of the day. A responsible adult should stay with you for 24 hours following the procedure.  For the next 24 hours, DO NOT: -Drive a car -Advertising copywriter -Drink alcoholic beverages -Take any medication unless instructed by your physician -Make any legal decisions or sign important papers.  Meals: Start with liquid foods such as gelatin or soup. Progress to regular foods as tolerated. Avoid greasy, spicy, heavy foods. If nausea and/or vomiting occur, drink only clear liquids until the nausea and/or vomiting subsides. Call your physician if vomiting  continues.  Special Instructions/Symptoms: Your throat may feel dry or sore from the anesthesia or the breathing tube placed in your throat during surgery. If this causes discomfort, gargle with warm salt water. The discomfort should disappear within 24 hours.  If you had a scopolamine patch placed behind your ear for the management of post- operative nausea and/or vomiting:  1. The medication in the patch is effective for 72 hours, after which it should be removed.  Wrap patch in a tissue and discard in the trash. Wash hands thoroughly with soap and water. 2. You may remove the patch earlier than 72 hours if you experience unpleasant side effects which may include dry mouth, dizziness or visual disturbances. 3. Avoid touching the patch. Wash your hands with soap and water after contact with the patch.

## 2020-02-25 NOTE — H&P (Signed)
I have had kidney stone surgery.  HPI: Andres Thompson is a 44 year-old male established patient who is here for renal calculi after a surgical intervention.  The stone was on both sides. He had Stent and Ureteroscopy for treatment of his renal calculi. Patient denies ESWL and PCNL. This procedure was done 11/21/2019. He did not pass a stone since the last office visit. This is not his first kidney stone. He does not have a stent in place.   He is not currently having flank pain, back pain, groin pain, nausea, vomiting, fever or chills.   He does not have dysuria. He does not have urgency. He does not have frequency.   12/19/2019: He follows up today for renal ultrasound. Patient underwent bilateral ureteroscopy on 03/24 with his urologist. 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 Bilateral stents were at the conclusion of the procedure with tether strings, patient removed the her instructions few days after the procedure.   Stone analysis was 80% brushite, 20% struvite.   Patient reports no further complication after bilateral ureteral stent removal at home. Currently voiding at his baseline without bothersome frequency or urgency. Denies any interval burning or painful urination, recurrence of flank pain/discomfort, visible blood in the urine, interval stone material passage.   6.23.2021: Here today c/o left sided flank pain w/ associated nausea (started this morning). He denies any changes in urinary pattern.     ALLERGIES: None   MEDICATIONS: Potassium Citrate Er 10 meq (1,080 mg) tablet, extended release 1 tablet PO BID     GU PSH: Cysto Uretero Lithotripsy Ureteroscopic laser litho, Bilateral - 11/21/2019       PSH Notes: L foot Fx  repair with hardware  L knee Meniscus repair    NON-GU PSH: None   GU PMH: Renal calculus - 12/19/2019 Ureteral calculus - 12/19/2019, - 11/14/2019 Flank Pain - 11/14/2019    NON-GU PMH: Cardiac murmur, unspecified GERD Phlebitis and thrombophlebitis of unspecified deep vessels of unspecified lower extremity    FAMILY HISTORY: 1 Daughter - No Family History 1 son - No Family History   SOCIAL HISTORY: Marital Status: Married Preferred Language: English; Race: White Current Smoking Status: Patient has never smoked.   Tobacco Use Assessment Completed: Used Tobacco in last 30 days? Has never drank.  Drinks 1 caffeinated drink per day.    REVIEW OF SYSTEMS:    GU Review Male:   Patient denies frequent urination, hard to postpone urination, burning/ pain with urination, get up at night to urinate, leakage of urine, stream starts and stops, trouble starting your stream, have to strain to urinate , erection problems, and penile pain.  Gastrointestinal (Upper):   Patient reports nausea. Patient denies vomiting and indigestion/ heartburn.  Gastrointestinal (Lower):   Patient denies diarrhea and constipation.  Constitutional:   Patient denies fever, night sweats, weight loss, and fatigue.  Skin:   Patient denies skin rash/ lesion and itching.  Eyes:   Patient denies blurred vision and double vision.  Ears/ Nose/ Throat:   Patient denies sinus problems and sore throat.  Hematologic/Lymphatic:   Patient denies swollen glands and easy bruising.  Cardiovascular:   Patient denies leg swelling and chest pains.  Respiratory:   Patient denies cough and shortness of breath.  Endocrine:   Patient denies  excessive thirst.  Musculoskeletal:   Patient denies back pain and joint pain.  Neurological:   Patient denies headaches and dizziness.  Psychologic:   Patient denies depression and anxiety.   Notes: Left Flank Pain, No blood    VITAL SIGNS:      02/20/2020 11:29 AM  Weight 250 lb / 113.4 kg   Height 78 in / 198.12 cm  BP 117/69 mmHg  Heart Rate 67 /min  Temperature 97.7 F / 36.5 C  BMI 28.9 kg/m   MULTI-SYSTEM PHYSICAL EXAMINATION:    Constitutional: Well-nourished. No physical deformities. Normally developed. Good grooming.  Neck: Neck symmetrical, not swollen. Normal tracheal position.  Respiratory: No labored breathing, no use of accessory muscles.   Skin: No paleness, no jaundice, no cyanosis. No lesion, no ulcer, no rash.  Neurologic / Psychiatric: Oriented to time, oriented to place, oriented to person. No depression, no anxiety, no agitation.  Gastrointestinal: Left CVA and left lower quadrant tenderness.   Eyes: Normal conjunctivae. Normal eyelids.  Ears, Nose, Mouth, and Throat: Left ear no scars, no lesions, no masses. Right ear no scars, no lesions, no masses. Nose no scars, no lesions, no masses. Normal hearing. Normal lips.  Musculoskeletal: Normal gait and station of head and neck.     Complexity of Data:  X-Ray Review: C.T. Stone Protocol: Reviewed Films. Prior CT urogram reviewed    PROCEDURES:         KUB - 74018  A single view of the abdomen is obtained. I independently reviewed the image. There is a 5 x 9 mm calcification in the area of the left UPJ. Phleboliths are noted in the pelvis also small calcification noted overlying the left lower pole. Bony structures are normal. Soft tissue shadows normal.      Patient confirmed No Neulasta OnPro Device.            Urinalysis w/Scope - 81001 Dipstick Dipstick Cont'd Micro  Color: Yellow Bilirubin: Neg WBC/hpf: NS (Not Seen)  Appearance: Cloudy Ketones: Neg RBC/hpf: >60/hpf  Specific Gravity: 1.025 Blood: 3+ Bacteria: Rare (0-9/hpf)  pH: 6.5 Protein: 1+ Cystals: NS (Not Seen)  Glucose: Neg Urobilinogen: 0.2 Casts: NS (Not Seen)    Nitrites: Neg Trichomonas: Not Present    Leukocyte Esterase: Neg Mucous: Not Present      Epithelial Cells: NS (Not Seen)      Yeast: NS (Not Seen)      Sperm: Not  Present    Notes:      ASSESSMENT:      ICD-10 Details  1 GU:   Renal calculus - N20.0 Chronic, Worsening - Recurrent stone disease-symptomatic, moderate sized left UPJ stone   PLAN:            Medications New Meds: Hydrocodone-Acetaminophen 10 mg-325 mg tablet 1 tablet PO q 4-6 hrs prn pain   #20  0 Refill(s)            Orders X-Rays: KUB          Schedule         Document Letter(s):  Created for Patient: Clinical Summary         Notes:   We discussed treatment options. This stone is fairly proximal and may well not passed. He would like to proceed with lithotripsy. I have discussed the procedure with him as well as alternative ureteroscopy. He would like to try to stay away from a stent.   He was given a prescription for hydrocodone   We  will set up his lithotripsy for early next week

## 2020-02-25 NOTE — Interval H&P Note (Signed)
History and Physical Interval Note:  Andres Thompson is now at the [facility].   02/25/2020 3:47 PM  Andres Thompson  has presented today for surgery, with the diagnosis of LEFT URETERAL STONES.  The various methods of treatment have been discussed with the patient and family. After consideration of risks, benefits and other options for treatment, the patient has consented to  Procedure(s): EXTRACORPOREAL SHOCK WAVE LITHOTRIPSY (ESWL) (Left) as a surgical intervention.  The patient's history has been reviewed, patient examined, no change in status, stable for surgery.  I have reviewed the patient's chart and labs.  Questions were answered to the patient's satisfaction.     Bjorn Pippin

## 2020-02-26 ENCOUNTER — Encounter (HOSPITAL_BASED_OUTPATIENT_CLINIC_OR_DEPARTMENT_OTHER): Payer: Self-pay | Admitting: Urology

## 2020-02-26 ENCOUNTER — Other Ambulatory Visit (HOSPITAL_COMMUNITY): Payer: BC Managed Care – PPO

## 2020-03-20 ENCOUNTER — Encounter (HOSPITAL_BASED_OUTPATIENT_CLINIC_OR_DEPARTMENT_OTHER): Payer: Self-pay | Admitting: Urology

## 2020-03-20 ENCOUNTER — Other Ambulatory Visit: Payer: Self-pay | Admitting: Urology

## 2020-03-20 ENCOUNTER — Other Ambulatory Visit: Payer: Self-pay

## 2020-03-20 NOTE — Progress Notes (Signed)
Spoke w/ via phone for pre-op interview-- PT- Lab needs dos----   no            Lab results------ no COVID test ------ 03-21-2020 @ 1410 Arrive at -------  0815 NPO after MN NO Solid Food.  Clear liquids from MN until--- 0715 then nothing by mouth Medications to take morning of surgery ----- NONE Diabetic medication ----- n/a Patient Special Instructions ----- n/a Pre-Op special Istructions ----- n/a Patient verbalized understanding of instructions that were given at this phone interview. Patient denies shortness of breath, chest pain, fever, cough a this phone interview.

## 2020-03-21 ENCOUNTER — Other Ambulatory Visit (HOSPITAL_COMMUNITY)
Admission: RE | Admit: 2020-03-21 | Discharge: 2020-03-21 | Disposition: A | Discharge status: Home / Self Care | Payer: BC Managed Care – PPO | Source: Ambulatory Visit | Attending: Urology | Admitting: Urology

## 2020-03-21 DIAGNOSIS — Z01812 Encounter for preprocedural laboratory examination: Secondary | ICD-10-CM | POA: Insufficient documentation

## 2020-03-21 DIAGNOSIS — Z20822 Contact with and (suspected) exposure to covid-19: Secondary | ICD-10-CM | POA: Insufficient documentation

## 2020-03-21 LAB — SARS CORONAVIRUS 2 (TAT 6-24 HRS): SARS Coronavirus 2: NEGATIVE

## 2020-03-25 ENCOUNTER — Encounter (HOSPITAL_BASED_OUTPATIENT_CLINIC_OR_DEPARTMENT_OTHER): Payer: Self-pay | Admitting: Anesthesiology

## 2020-03-25 ENCOUNTER — Ambulatory Visit (HOSPITAL_BASED_OUTPATIENT_CLINIC_OR_DEPARTMENT_OTHER): Admission: RE | Admit: 2020-03-25 | Payer: BC Managed Care – PPO | Source: Home / Self Care | Admitting: Urology

## 2020-03-25 HISTORY — DX: Calculus of ureter: N20.1

## 2020-03-25 HISTORY — DX: Presence of spectacles and contact lenses: Z97.3

## 2020-03-25 SURGERY — CYSTOSCOPY/URETEROSCOPY/HOLMIUM LASER/STENT PLACEMENT
Anesthesia: General | Laterality: Left

## 2020-03-25 NOTE — Anesthesia Preprocedure Evaluation (Deleted)
Anesthesia Evaluation    Reviewed: Allergy & Precautions, Patient's Chart, lab work & pertinent test results  History of Anesthesia Complications (+) PONVNegative for: history of anesthetic complications  Airway        Dental   Pulmonary neg pulmonary ROS,           Cardiovascular negative cardio ROS       Neuro/Psych negative neurological ROS  negative psych ROS   GI/Hepatic Neg liver ROS, GERD  ,  Endo/Other  negative endocrine ROS  Renal/GU Renal disease (nephrolithiasis)  negative genitourinary   Musculoskeletal negative musculoskeletal ROS (+)   Abdominal   Peds  Hematology negative hematology ROS (+)   Anesthesia Other Findings   Reproductive/Obstetrics                             Anesthesia Physical  Anesthesia Plan  ASA: II  Anesthesia Plan: General   Post-op Pain Management:    Induction: Intravenous  PONV Risk Score and Plan: 3 and Ondansetron, Dexamethasone, Midazolam and Treatment may vary due to age or medical condition  Airway Management Planned: LMA  Additional Equipment: None  Intra-op Plan:   Post-operative Plan: Extubation in OR  Informed Consent:   Plan Discussed with:   Anesthesia Plan Comments:         Anesthesia Quick Evaluation

## 2020-08-05 ENCOUNTER — Other Ambulatory Visit (HOSPITAL_COMMUNITY)
Admission: RE | Admit: 2020-08-05 | Discharge: 2020-08-05 | Disposition: A | Discharge status: Home / Self Care | Payer: BC Managed Care – PPO | Source: Ambulatory Visit | Attending: Urology | Admitting: Urology

## 2020-08-05 ENCOUNTER — Other Ambulatory Visit: Payer: Self-pay | Admitting: Urology

## 2020-08-05 DIAGNOSIS — Z6828 Body mass index (BMI) 28.0-28.9, adult: Secondary | ICD-10-CM | POA: Diagnosis not present

## 2020-08-05 DIAGNOSIS — Z87442 Personal history of urinary calculi: Secondary | ICD-10-CM | POA: Diagnosis not present

## 2020-08-05 DIAGNOSIS — Z01812 Encounter for preprocedural laboratory examination: Secondary | ICD-10-CM | POA: Insufficient documentation

## 2020-08-05 DIAGNOSIS — E669 Obesity, unspecified: Secondary | ICD-10-CM | POA: Diagnosis not present

## 2020-08-05 DIAGNOSIS — Z20822 Contact with and (suspected) exposure to covid-19: Secondary | ICD-10-CM | POA: Diagnosis not present

## 2020-08-05 DIAGNOSIS — N200 Calculus of kidney: Secondary | ICD-10-CM | POA: Diagnosis not present

## 2020-08-05 LAB — SARS CORONAVIRUS 2 (TAT 6-24 HRS): SARS Coronavirus 2: NEGATIVE

## 2020-08-05 NOTE — Progress Notes (Signed)
Patient to arrive at 1030 on 08/06/2020. History and medications reviewed. Pre-procedure instructions given. NPO after MN except for clear liquids until 0830. Driver secured.

## 2020-08-07 ENCOUNTER — Other Ambulatory Visit: Payer: Self-pay

## 2020-08-07 ENCOUNTER — Ambulatory Visit (HOSPITAL_COMMUNITY): Payer: BC Managed Care – PPO

## 2020-08-07 ENCOUNTER — Encounter (HOSPITAL_BASED_OUTPATIENT_CLINIC_OR_DEPARTMENT_OTHER)
Admission: RE | Disposition: A | Discharge status: Home / Self Care | Payer: Self-pay | Source: Home / Self Care | Attending: Urology

## 2020-08-07 ENCOUNTER — Ambulatory Visit (HOSPITAL_BASED_OUTPATIENT_CLINIC_OR_DEPARTMENT_OTHER)
Admission: RE | Admit: 2020-08-07 | Discharge: 2020-08-07 | Disposition: A | Discharge status: Home / Self Care | Payer: BC Managed Care – PPO | Attending: Urology | Admitting: Urology

## 2020-08-07 ENCOUNTER — Encounter (HOSPITAL_BASED_OUTPATIENT_CLINIC_OR_DEPARTMENT_OTHER): Payer: Self-pay | Admitting: Urology

## 2020-08-07 DIAGNOSIS — E669 Obesity, unspecified: Secondary | ICD-10-CM | POA: Insufficient documentation

## 2020-08-07 DIAGNOSIS — N200 Calculus of kidney: Secondary | ICD-10-CM | POA: Diagnosis not present

## 2020-08-07 DIAGNOSIS — Z6828 Body mass index (BMI) 28.0-28.9, adult: Secondary | ICD-10-CM | POA: Insufficient documentation

## 2020-08-07 DIAGNOSIS — Z20822 Contact with and (suspected) exposure to covid-19: Secondary | ICD-10-CM | POA: Insufficient documentation

## 2020-08-07 DIAGNOSIS — Z87442 Personal history of urinary calculi: Secondary | ICD-10-CM | POA: Insufficient documentation

## 2020-08-07 HISTORY — PX: EXTRACORPOREAL SHOCK WAVE LITHOTRIPSY: SHX1557

## 2020-08-07 SURGERY — LITHOTRIPSY, ESWL
Anesthesia: LOCAL | Laterality: Left

## 2020-08-07 MED ORDER — OXYCODONE-ACETAMINOPHEN 5-325 MG PO TABS
ORAL_TABLET | ORAL | Status: AC
  Filled 2020-08-07: qty 1

## 2020-08-07 MED ORDER — DIAZEPAM 5 MG PO TABS
10.0000 mg | ORAL_TABLET | ORAL | Status: AC
  Administered 2020-08-07: 10 mg via ORAL

## 2020-08-07 MED ORDER — TAMSULOSIN HCL 0.4 MG PO CAPS: 0.4000 mg | ORAL_CAPSULE | Freq: Every day | ORAL | 0 refills | Status: DC

## 2020-08-07 MED ORDER — SODIUM CHLORIDE 0.9 % IV SOLN: INTRAVENOUS | Status: DC

## 2020-08-07 MED ORDER — DIPHENHYDRAMINE HCL 25 MG PO CAPS
25.0000 mg | ORAL_CAPSULE | ORAL | Status: AC
  Administered 2020-08-07: 25 mg via ORAL

## 2020-08-07 MED ORDER — DIAZEPAM 5 MG PO TABS
ORAL_TABLET | ORAL | Status: AC
  Filled 2020-08-07: qty 2

## 2020-08-07 MED ORDER — OXYCODONE-ACETAMINOPHEN 5-325 MG PO TABS
1.0000 | ORAL_TABLET | ORAL | 0 refills | Status: DC | PRN
Start: 2020-08-07 — End: 2021-01-05

## 2020-08-07 MED ORDER — CIPROFLOXACIN HCL 500 MG PO TABS
500.0000 mg | ORAL_TABLET | ORAL | Status: AC
  Administered 2020-08-07: 500 mg via ORAL

## 2020-08-07 MED ORDER — OXYCODONE-ACETAMINOPHEN 5-325 MG PO TABS
1.0000 | ORAL_TABLET | Freq: Once | ORAL | Status: AC
  Administered 2020-08-07: 1 via ORAL

## 2020-08-07 MED ORDER — DIPHENHYDRAMINE HCL 25 MG PO CAPS
ORAL_CAPSULE | ORAL | Status: AC
  Filled 2020-08-07: qty 1

## 2020-08-07 MED ORDER — CIPROFLOXACIN HCL 500 MG PO TABS
ORAL_TABLET | ORAL | Status: AC
  Filled 2020-08-07: qty 1

## 2020-08-07 NOTE — Brief Op Note (Signed)
08/07/2020  12:01 PM  PATIENT:  Andres Thompson  44 y.o. male  PRE-OPERATIVE DIAGNOSIS:  LEFT URETERAL PELVIC JUNCTION STONE  POST-OPERATIVE DIAGNOSIS:  * No post-op diagnosis entered *  PROCEDURE:  Procedure(s): EXTRACORPOREAL SHOCK WAVE LITHOTRIPSY (ESWL) (Left)  SURGEON:  Surgeon(s) and Role:    * Belva Agee, MD - Primary  PHYSICIAN ASSISTANT: n/a   ASSISTANTS: none   ANESTHESIA:   IV sedation  EBL:  minimal   BLOOD ADMINISTERED:none  DRAINS: none   LOCAL MEDICATIONS USED:  NONE  SPECIMEN:  No Specimen  DISPOSITION OF SPECIMEN:  N/A  COUNTS:  YES  TOURNIQUET:  * No tourniquets in log *  DICTATION: .Note written in EPIC  PLAN OF CARE: Discharge to home after PACU  PATIENT DISPOSITION:  PACU - hemodynamically stable.   Delay start of Pharmacological VTE agent (>24hrs) due to surgical blood loss or risk of bleeding: yes

## 2020-08-07 NOTE — Op Note (Signed)
08/07/2020  12:01 PM  PATIENT:  Andres Thompson  44 y.o. male  PRE-OPERATIVE DIAGNOSIS:  LEFT URETERAL PELVIC JUNCTION STONE  POST-OPERATIVE DIAGNOSIS:  * No post-op diagnosis entered *  PROCEDURE:  Procedure(s): EXTRACORPOREAL SHOCK WAVE LITHOTRIPSY (ESWL) (Left)  SURGEON:  Surgeon(s) and Role:    * Lydell Moga B, MD - Primary  PHYSICIAN ASSISTANT: n/a   ASSISTANTS: none   ANESTHESIA:   IV sedation  EBL:  minimal   BLOOD ADMINISTERED:none  DRAINS: none   LOCAL MEDICATIONS USED:  NONE  SPECIMEN:  No Specimen  DISPOSITION OF SPECIMEN:  N/A  COUNTS:  YES  TOURNIQUET:  * No tourniquets in log *  DICTATION: .Note written in EPIC  PLAN OF CARE: Discharge to home after PACU  PATIENT DISPOSITION:  PACU - hemodynamically stable.   Delay start of Pharmacological VTE agent (>24hrs) due to surgical blood loss or risk of bleeding: yes  

## 2020-08-07 NOTE — Discharge Instructions (Signed)
Lithotripsy, Care After This sheet gives you information about how to care for yourself after your procedure. Your health care provider may also give you more specific instructions. If you have problems or questions, contact your health care provider. What can I expect after the procedure? After the procedure, it is common to have:  Some blood in your urine. This should only last for a few days.  Soreness in your back, sides, or upper abdomen for a few days.  Blotches or bruises on your back where the pressure wave entered the skin.  Pain, discomfort, or nausea when pieces (fragments) of the kidney stone move through the tube that carries urine from the kidney to the bladder (ureter). Stone fragments may pass soon after the procedure, but they may continue to pass for up to 4-8 weeks. ? If you have severe pain or nausea, contact your health care provider. This may be caused by a large stone that was not broken up, and this may mean that you need more treatment.  Some pain or discomfort during urination.  Some pain or discomfort in the lower abdomen or (in men) at the base of the penis. Follow these instructions at home: Medicines  Take over-the-counter and prescription medicines only as told by your health care provider.  If you were prescribed an antibiotic medicine, take it as told by your health care provider. Do not stop taking the antibiotic even if you start to feel better.  Do not drive for 24 hours if you were given a medicine to help you relax (sedative).  Do not drive or use heavy machinery while taking prescription pain medicine. Eating and drinking      Drink enough water and fluids to keep your urine clear or pale yellow. This helps any remaining pieces of the stone to pass. It can also help prevent new stones from forming.  Eat plenty of fresh fruits and vegetables.  Follow instructions from your health care provider about eating and drinking restrictions. You may be  instructed: ? To reduce how much salt (sodium) you eat or drink. Check ingredients and nutrition facts on packaged foods and beverages. ? To reduce how much meat you eat.  Eat the recommended amount of calcium for your age and gender. Ask your health care provider how much calcium you should have. General instructions  Get plenty of rest.  Most people can resume normal activities 1-2 days after the procedure. Ask your health care provider what activities are safe for you.  Your health care provider may direct you to lie in a certain position (postural drainage) and tap firmly (percuss) over your kidney area to help stone fragments pass. Follow instructions as told by your health care provider.  If directed, strain all urine through the strainer that was provided by your health care provider. ? Keep all fragments for your health care provider to see. Any stones that are found may be sent to a medical lab for examination. The stone may be as small as a grain of salt.  Keep all follow-up visits as told by your health care provider. This is important. Contact a health care provider if:  You have pain that is severe or does not get better with medicine.  You have nausea that is severe or does not go away.  You have blood in your urine longer than your health care provider told you to expect.  You have more blood in your urine.  You have pain during urination that does   not go away.  You urinate more frequently than usual and this does not go away.  You develop a rash or any other possible signs of an allergic reaction. Get help right away if:  You have severe pain in your back, sides, or upper abdomen.  You have severe pain while urinating.  Your urine is very dark red.  You have blood in your stool (feces).  You cannot pass any urine at all.  You feel a strong urge to urinate after emptying your bladder.  You have a fever or chills.  You develop shortness of breath,  difficulty breathing, or chest pain.  You have severe nausea that leads to persistent vomiting.  You faint. Summary  After this procedure, it is common to have some pain, discomfort, or nausea when pieces (fragments) of the kidney stone move through the tube that carries urine from the kidney to the bladder (ureter). If this pain or nausea is severe, however, you should contact your health care provider.  Most people can resume normal activities 1-2 days after the procedure. Ask your health care provider what activities are safe for you.  Drink enough water and fluids to keep your urine clear or pale yellow. This helps any remaining pieces of the stone to pass, and it can help prevent new stones from forming.  If directed, strain your urine and keep all fragments for your health care provider to see. Fragments or stones may be as small as a grain of salt.  Get help right away if you have severe pain in your back, sides, or upper abdomen or have severe pain while urinating. This information is not intended to replace advice given to you by your health care provider. Make sure you discuss any questions you have with your health care provider. Document Revised: 11/27/2018 Document Reviewed: 07/07/2016 Elsevier Patient Education  2020 [employer]  

## 2020-08-07 NOTE — Interval H&P Note (Signed)
History and Physical Interval Note:  08/07/2020 12:00 PM  Andres Thompson  has presented today for surgery, with the diagnosis of LEFT URETERAL PELVIC JUNCTION STONE.  The various methods of treatment have been discussed with the patient and family. After consideration of risks, benefits and other options for treatment, the patient has consented to  Procedure(s): EXTRACORPOREAL SHOCK WAVE LITHOTRIPSY (ESWL) (Left) as a surgical intervention.  The patient's history has been reviewed, patient examined, no change in status, stable for surgery.  I have reviewed the patient's chart and labs.  Questions were answered to the patient's satisfaction.     Belva Agee

## 2020-08-07 NOTE — H&P (Signed)
Acute Kidney Stone  HPI: Andres Thompson is a 44 year-old male established patient who is here for further eval and management of kidney stones.  He was diagnosed with a kidney stone on 08/04/2020.   The pain is on the left side.   Abdomen/Pelvic CT: 08/04/20: Numerous left-sided renal calculi with 8 mm calculus in renal pelvis. The patient underwent CT scan prior to today's appointment.   The patient relates initially having nausea and flank pain. He is currently having flank pain, back pain, and nausea. He denies having groin pain, vomiting, fever, chills, and voiding symptoms. He has been taking Flomax 0.4 mg. He has not caught a stone in his urine strainer since his symptoms began.   He has had ESWL and Ureteroscopy for treatment of his stones in the past. This is not his first kidney stone. He has had more than 5 stones prior to getting this one.   Patient recently treated for a left UPJ stone. Follow-up CT scan did demonstrate resolution of that with bilateral nonobstructing stones. Yesterday the patient presented to [facility] but all with acute onset left-sided flank pain. A CT scan was obtained at that time. This demonstrated a 8 mm left UPJ stone. He also had 7 mm stone in the left upper pole, nonobstructive and 3 smaller stone in the left lower pole, nonobstructive.     ALLERGIES: None   MEDICATIONS: Ditropan Xl 5 mg tablet, extended release 24 hr 1 tablet PO Daily  Flomax 0.4 mg capsule  Hydrocodone-Acetaminophen  Potassium Citrate Er 10 meq (1,080 mg) tablet, extended release 1 tablet PO BID     GU PSH: Cysto Uretero Lithotripsy ESWL, Left - 02/25/2020 Ureteroscopic laser litho, Bilateral - 11/21/2019       PSH Notes: L foot Fx repair with hardware  L knee Meniscus repair    NON-GU PSH: None   GU PMH: Renal calculus - 07/15/2020, Recurrent stone disease-symptomatic, moderate sized left UPJ stone, - 02/20/2020, - 12/19/2019 Nocturia - 05/01/2020 Ureteral obstruction  secondary to calculous - 03/20/2020 Renal and ureteral calculus - 03/05/2020 Ureteral calculus - 12/19/2019, - 11/14/2019 Flank Pain - 11/14/2019    NON-GU PMH: Pyuria/other UA findings - 07/15/2020 Cardiac murmur, unspecified GERD Phlebitis and thrombophlebitis of unspecified deep vessels of unspecified lower extremity    FAMILY HISTORY: 1 Daughter - No Family History 1 son - No Family History   SOCIAL HISTORY: Marital Status: Married Preferred Language: English; Race: White Current Smoking Status: Patient has never smoked.   Tobacco Use Assessment Completed: Used Tobacco in last 30 days? Has never drank.  Drinks 1 caffeinated drink per day.    REVIEW OF SYSTEMS:    GU Review Male:   Patient denies frequent urination, hard to postpone urination, burning/ pain with urination, get up at night to urinate, leakage of urine, stream starts and stops, trouble starting your stream, have to strain to urinate , erection problems, and penile pain.  Gastrointestinal (Upper):   Patient denies nausea, vomiting, and indigestion/ heartburn.  Gastrointestinal (Lower):   Patient denies constipation and diarrhea.  Constitutional:   Patient denies fever, night sweats, weight loss, and fatigue.  Skin:   Patient denies skin rash/ lesion and itching.  Eyes:   Patient denies blurred vision and double vision.  Ears/ Nose/ Throat:   Patient denies sore throat and sinus problems.  Hematologic/Lymphatic:   Patient denies swollen glands and easy bruising.  Cardiovascular:   Patient denies leg swelling and chest pains.  Respiratory:  Patient denies cough and shortness of breath.  Endocrine:   Patient denies excessive thirst.  Musculoskeletal:   Patient denies back pain and joint pain.  Neurological:   Patient denies headaches and dizziness.  Psychologic:   Patient denies depression and anxiety.   VITAL SIGNS:      08/05/2020 09:16 AM  Weight 250 lb / 113.4 kg  Height 79 in / 200.66 cm  BP 124/79 mmHg   Pulse 62 /min  Temperature 98.0 F / 36.6 C  BMI 28.2 kg/m   MULTI-SYSTEM PHYSICAL EXAMINATION:    Constitutional: Well-nourished. No physical deformities. Normally developed. Good grooming.  Respiratory: Normal breath sounds. No labored breathing, no use of accessory muscles.   Cardiovascular: Regular rate and rhythm. No murmur, no gallop. Normal temperature, normal extremity pulses, no swelling, no varicosities.   Gastrointestinal: Left CVA tenderness     Complexity of Data:  Source Of History:  Patient  Records Review:   Previous Doctor Records, Previous Patient Records  Urine Test Review:   Urinalysis  X-Ray Review: C.T. Abdomen/Pelvis: Reviewed Films. Discussed With Patient.     PROCEDURES:         KUB - [id number]  A single view of the abdomen is obtained. Renal shadows are easily visualized bilaterally. There are no stones appreciated within the expected location in either renal pelvis. There are no additional calcifications along the expected location of either ureter bilaterally.  Gas pattern is grossly normal. No significant bony abnormalities.      Patient confirmed No Neulasta OnPro Device.           Urinalysis w/Scope Dipstick Dipstick Cont'd Micro  Color: Yellow Bilirubin: Neg mg/dL WBC/hpf: NS (Not Seen)  Appearance: Cloudy Ketones: Neg mg/dL RBC/hpf: 40 - 07/PXT  Specific Gravity: 1.025 Blood: 3+ ery/uL Bacteria: Rare (0-9/hpf)  pH: 6.0 Protein: Neg mg/dL Cystals: NS (Not Seen)  Glucose: Neg mg/dL Urobilinogen: 0.2 mg/dL Casts: NS (Not Seen)    Nitrites: Neg Trichomonas: Not Present    Leukocyte Esterase: Neg leu/uL Mucous: Not Present      Epithelial Cells: NS (Not Seen)      Yeast: NS (Not Seen)      Sperm: Not Present    ASSESSMENT:      ICD-10 Details  1 GU:   Renal and ureteral calculus - N20.2   2   Ureteral calculus - N20.1    PLAN:            Medications Stop Meds: Hydrocodone-Acetaminophen 10 mg-325 mg tablet 1 tablet PO q 4-6 hrs prn pain   Start: 02/20/2020  Discontinue: 08/05/2020  - Reason: The medication cycle was completed.            Orders X-Rays: KUB          Schedule Return Visit/Planned Activity: ASAP - Schedule Surgery          Document Letter(s):  Created for Patient: Clinical Summary         Notes:   The patient has a stone at the left UVJ that appears to be ball valving. He has several other additional stones on the left side. We discussed treatment options including ureteroscopy and shockwave lithotripsy. The patient has been through both in his opted to proceed with shockwave lithotripsy known that he can only treat 1 stone in time. Will try to get the stone treated at the left UPJ 1st. I discussed the procedure with him including the risks and the benefits. Understands the risk of failure  as well as the risk for hematoma and hematuria. He understands chance that he may need additional procedures in the future. He did take Toradol this morning, will if he stops it now he should be okay for 48 hours from now to get him scheduled.

## 2020-08-08 ENCOUNTER — Encounter (HOSPITAL_BASED_OUTPATIENT_CLINIC_OR_DEPARTMENT_OTHER): Payer: Self-pay | Admitting: Urology

## 2020-08-26 ENCOUNTER — Other Ambulatory Visit: Payer: Self-pay | Admitting: Urology

## 2020-08-26 DIAGNOSIS — N200 Calculus of kidney: Secondary | ICD-10-CM

## 2020-08-27 DIAGNOSIS — Z79899 Other long term (current) drug therapy: Secondary | ICD-10-CM | POA: Insufficient documentation

## 2020-08-27 DIAGNOSIS — Z20822 Contact with and (suspected) exposure to covid-19: Secondary | ICD-10-CM | POA: Insufficient documentation

## 2020-08-27 DIAGNOSIS — N200 Calculus of kidney: Secondary | ICD-10-CM | POA: Insufficient documentation

## 2020-08-27 DIAGNOSIS — Z87442 Personal history of urinary calculi: Secondary | ICD-10-CM | POA: Insufficient documentation

## 2020-08-27 NOTE — Progress Notes (Signed)
Talked with patient. Instructions given arrival time 0800 Covid test Friday at 11 am. Knows to be quarantined until Monday. Mom is the driver. Cl liquids until 0600

## 2020-08-29 ENCOUNTER — Other Ambulatory Visit (HOSPITAL_COMMUNITY)
Admission: RE | Admit: 2020-08-29 | Discharge: 2020-08-29 | Disposition: A | Discharge status: Home / Self Care | Payer: BC Managed Care – PPO | Source: Ambulatory Visit | Attending: Urology | Admitting: Urology

## 2020-08-29 DIAGNOSIS — Z20822 Contact with and (suspected) exposure to covid-19: Secondary | ICD-10-CM | POA: Insufficient documentation

## 2020-08-29 DIAGNOSIS — Z01812 Encounter for preprocedural laboratory examination: Secondary | ICD-10-CM | POA: Insufficient documentation

## 2020-08-29 DIAGNOSIS — Z87442 Personal history of urinary calculi: Secondary | ICD-10-CM | POA: Diagnosis not present

## 2020-08-29 DIAGNOSIS — Z79899 Other long term (current) drug therapy: Secondary | ICD-10-CM | POA: Diagnosis not present

## 2020-08-29 DIAGNOSIS — N200 Calculus of kidney: Secondary | ICD-10-CM | POA: Diagnosis not present

## 2020-08-29 LAB — SARS CORONAVIRUS 2 (TAT 6-24 HRS): SARS Coronavirus 2: NEGATIVE

## 2020-08-30 DIAGNOSIS — Z79899 Other long term (current) drug therapy: Secondary | ICD-10-CM | POA: Diagnosis not present

## 2020-08-30 DIAGNOSIS — Z87442 Personal history of urinary calculi: Secondary | ICD-10-CM | POA: Diagnosis not present

## 2020-08-30 DIAGNOSIS — N200 Calculus of kidney: Secondary | ICD-10-CM | POA: Diagnosis present

## 2020-08-30 DIAGNOSIS — Z20822 Contact with and (suspected) exposure to covid-19: Secondary | ICD-10-CM | POA: Diagnosis not present

## 2020-09-01 ENCOUNTER — Other Ambulatory Visit: Payer: Self-pay

## 2020-09-01 ENCOUNTER — Encounter (HOSPITAL_BASED_OUTPATIENT_CLINIC_OR_DEPARTMENT_OTHER)
Admission: RE | Disposition: A | Discharge status: Home / Self Care | Payer: Self-pay | Source: Home / Self Care | Attending: Urology

## 2020-09-01 ENCOUNTER — Ambulatory Visit (HOSPITAL_COMMUNITY): Payer: BC Managed Care – PPO

## 2020-09-01 ENCOUNTER — Encounter (HOSPITAL_BASED_OUTPATIENT_CLINIC_OR_DEPARTMENT_OTHER): Payer: Self-pay | Admitting: Urology

## 2020-09-01 ENCOUNTER — Ambulatory Visit (HOSPITAL_BASED_OUTPATIENT_CLINIC_OR_DEPARTMENT_OTHER)
Admission: RE | Admit: 2020-09-01 | Discharge: 2020-09-01 | Disposition: A | Discharge status: Home / Self Care | Payer: BC Managed Care – PPO | Attending: Urology | Admitting: Urology

## 2020-09-01 DIAGNOSIS — Z87442 Personal history of urinary calculi: Secondary | ICD-10-CM | POA: Insufficient documentation

## 2020-09-01 DIAGNOSIS — Z79899 Other long term (current) drug therapy: Secondary | ICD-10-CM | POA: Insufficient documentation

## 2020-09-01 DIAGNOSIS — N200 Calculus of kidney: Secondary | ICD-10-CM | POA: Diagnosis not present

## 2020-09-01 DIAGNOSIS — Z20822 Contact with and (suspected) exposure to covid-19: Secondary | ICD-10-CM | POA: Insufficient documentation

## 2020-09-01 HISTORY — PX: EXTRACORPOREAL SHOCK WAVE LITHOTRIPSY: SHX1557

## 2020-09-01 SURGERY — LITHOTRIPSY, ESWL
Anesthesia: LOCAL | Laterality: Left

## 2020-09-01 MED ORDER — DIAZEPAM 5 MG PO TABS
10.0000 mg | ORAL_TABLET | ORAL | Status: AC
  Administered 2020-09-01: 10 mg via ORAL

## 2020-09-01 MED ORDER — DIPHENHYDRAMINE HCL 25 MG PO CAPS
25.0000 mg | ORAL_CAPSULE | ORAL | Status: AC
  Administered 2020-09-01: 25 mg via ORAL

## 2020-09-01 MED ORDER — CIPROFLOXACIN HCL 500 MG PO TABS
ORAL_TABLET | ORAL | Status: AC
  Filled 2020-09-01: qty 1

## 2020-09-01 MED ORDER — DIPHENHYDRAMINE HCL 25 MG PO CAPS
ORAL_CAPSULE | ORAL | Status: AC
  Filled 2020-09-01: qty 1

## 2020-09-01 MED ORDER — SODIUM CHLORIDE 0.9 % IV SOLN: INTRAVENOUS | Status: DC

## 2020-09-01 MED ORDER — DIAZEPAM 5 MG PO TABS
ORAL_TABLET | ORAL | Status: AC
  Filled 2020-09-01: qty 2

## 2020-09-01 MED ORDER — CIPROFLOXACIN HCL 500 MG PO TABS
500.0000 mg | ORAL_TABLET | ORAL | Status: AC
  Administered 2020-09-01: 500 mg via ORAL

## 2020-09-01 NOTE — Discharge Instructions (Signed)
Lithotripsy, Care After This sheet gives you information about how to care for yourself after your procedure. Your health care provider may also give you more specific instructions. If you have problems or questions, contact your health care provider. What can I expect after the procedure? After the procedure, it is common to have:  Some blood in your urine. This should only last for a few days.  Soreness in your back, sides, or upper abdomen for a few days.  Blotches or bruises on your back where the pressure wave entered the skin.  Pain, discomfort, or nausea when pieces (fragments) of the kidney stone move through the tube that carries urine from the kidney to the bladder (ureter). Stone fragments may pass soon after the procedure, but they may continue to pass for up to 4-8 weeks. ? If you have severe pain or nausea, contact your health care provider. This may be caused by a large stone that was not broken up, and this may mean that you need more treatment.  Some pain or discomfort during urination.  Some pain or discomfort in the lower abdomen or (in men) at the base of the penis. Follow these instructions at home: Medicines  Take over-the-counter and prescription medicines only as told by your health care provider.  If you were prescribed an antibiotic medicine, take it as told by your health care provider. Do not stop taking the antibiotic even if you start to feel better.  Do not drive for 24 hours if you were given a medicine to help you relax (sedative).  Do not drive or use heavy machinery while taking prescription pain medicine. Eating and drinking      Drink enough water and fluids to keep your urine clear or pale yellow. This helps any remaining pieces of the stone to pass. It can also help prevent new stones from forming.  Eat plenty of fresh fruits and vegetables.  Follow instructions from your health care provider about eating and drinking restrictions. You may be  instructed: ? To reduce how much salt (sodium) you eat or drink. Check ingredients and nutrition facts on packaged foods and beverages. ? To reduce how much meat you eat.  Eat the recommended amount of calcium for your age and gender. Ask your health care provider how much calcium you should have. General instructions  Get plenty of rest.  Most people can resume normal activities 1-2 days after the procedure. Ask your health care provider what activities are safe for you.  Your health care provider may direct you to lie in a certain position (postural drainage) and tap firmly (percuss) over your kidney area to help stone fragments pass. Follow instructions as told by your health care provider.  If directed, strain all urine through the strainer that was provided by your health care provider. ? Keep all fragments for your health care provider to see. Any stones that are found may be sent to a medical lab for examination. The stone may be as small as a grain of salt.  Keep all follow-up visits as told by your health care provider. This is important. Contact a health care provider if:  You have pain that is severe or does not get better with medicine.  You have nausea that is severe or does not go away.  You have blood in your urine longer than your health care provider told you to expect.  You have more blood in your urine.  You have pain during urination that does   not go away.  You urinate more frequently than usual and this does not go away.  You develop a rash or any other possible signs of an allergic reaction. Get help right away if:  You have severe pain in your back, sides, or upper abdomen.  You have severe pain while urinating.  Your urine is very dark red.  You have blood in your stool (feces).  You cannot pass any urine at all.  You feel a strong urge to urinate after emptying your bladder.  You have a fever or chills.  You develop shortness of breath,  difficulty breathing, or chest pain.  You have severe nausea that leads to persistent vomiting.  You faint. Summary  After this procedure, it is common to have some pain, discomfort, or nausea when pieces (fragments) of the kidney stone move through the tube that carries urine from the kidney to the bladder (ureter). If this pain or nausea is severe, however, you should contact your health care provider.  Most people can resume normal activities 1-2 days after the procedure. Ask your health care provider what activities are safe for you.  Drink enough water and fluids to keep your urine clear or pale yellow. This helps any remaining pieces of the stone to pass, and it can help prevent new stones from forming.  If directed, strain your urine and keep all fragments for your health care provider to see. Fragments or stones may be as small as a grain of salt.  Get help right away if you have severe pain in your back, sides, or upper abdomen or have severe pain while urinating. This information is not intended to replace advice given to you by your health care provider. Make sure you discuss any questions you have with your health care provider. Document Revised: 11/27/2018 Document Reviewed: 07/07/2016 Elsevier Patient Education  2020 [employer]  

## 2020-09-01 NOTE — H&P (Signed)
Acute Kidney Stone  HPI: Andres Thompson is a 45 year-old male established patient who is here for further eval and management of kidney stones.  08/05/20: Patient recently treated for a left UPJ stone. Follow-up CT scan did demonstrate resolution of that with bilateral nonobstructing stones. Yesterday the patient presented to [facility] but all with acute onset left-sided flank pain. A CT scan was obtained at that time. This demonstrated a 8 mm left UPJ stone. He also had 7 mm stone in the left upper pole, nonobstructive and 3 smaller stone in the left lower pole, nonobstructive.   08/21/2020: Patient elected to proceed with shockwave lithotripsy for the left UPJ/renal pelvis stone. This was performed on 12/09. He returns today for follow-up exam. He has passed numerous stone fragments in the interval and brings is in today for analysis. He continues to have intermittent left lower back and flank pain/discomfort. Outside of acute exacerbations of pain, voiding symptoms have been grossly stable. He has had some periods of frequency/urgency as well as intermittency of stream and burning urination when pain exacerbates but otherwise voiding at his baseline. He denies fevers or chills. He does have some occasional nausea with pain exacerbations but has not been vomiting.   He was diagnosed with a kidney stone on 08/04/2020.   The pain is on the left side.   Abdomen/Pelvic CT: 08/04/20: Numerous left-sided renal calculi with 8 mm calculus in renal pelvis. The patient underwent CT scan prior to today's appointment.   The patient relates initially having nausea and flank pain. He is currently having back pain and nausea. He denies having flank pain, groin pain, vomiting, fever, chills, and voiding symptoms. He has been taking Flomax 0.4 mg. He has caught a stone in his urine strainer since his symptoms began.   He has had ESWL and Ureteroscopy for treatment of his stones in the past. This is not his first  kidney stone. He has had more than 5 stones prior to getting this one.     ALLERGIES: No Allergies    MEDICATIONS: Ditropan Xl 5 mg tablet, extended release 24 hr 1 tablet PO Daily  Flomax 0.4 mg capsule  Hydrocodone-Acetaminophen  Potassium Citrate Er 10 meq (1,080 mg) tablet, extended release 1 tablet PO BID     GU PSH: Cysto Uretero Lithotripsy ESWL, Left - 08/07/2020, Left - 02/25/2020 Ureteroscopic laser litho, Bilateral - 11/21/2019       PSH Notes: L foot Fx repair with hardware  L knee Meniscus repair    NON-GU PSH: No Non-GU PSH    GU PMH: Renal and ureteral calculus - 08/05/2020, - 03/05/2020 Ureteral calculus - 08/05/2020, - 12/19/2019, - 11/14/2019 Renal calculus - 07/15/2020, Recurrent stone disease-symptomatic, moderate sized left UPJ stone, - 02/20/2020, - 12/19/2019 Nocturia - 05/01/2020 Ureteral obstruction secondary to calculous - 03/20/2020 Flank Pain - 11/14/2019    NON-GU PMH: Pyuria/other UA findings - 07/15/2020 Cardiac murmur, unspecified GERD Phlebitis and thrombophlebitis of unspecified deep vessels of unspecified lower extremity    FAMILY HISTORY: 1 Daughter - No Family History 1 son - No Family History   SOCIAL HISTORY: Marital Status: Married Preferred Language: English; Race: White Current Smoking Status: Patient has never smoked.   Tobacco Use Assessment Completed: Used Tobacco in last 30 days? Has never drank.  Drinks 1 caffeinated drink per day.    REVIEW OF SYSTEMS:    GU Review Male:   Patient denies frequent urination, hard to postpone urination, burning/ pain with urination, get up  at night to urinate, leakage of urine, stream starts and stops, trouble starting your stream, have to strain to urinate , erection problems, and penile pain.  Gastrointestinal (Upper):   Patient denies nausea, vomiting, and indigestion/ heartburn.  Gastrointestinal (Lower):   Patient denies diarrhea and constipation.  Constitutional:   Patient denies fever, night  sweats, weight loss, and fatigue.  Skin:   Patient denies skin rash/ lesion and itching.  Eyes:   Patient denies blurred vision and double vision.  Ears/ Nose/ Throat:   Patient denies sore throat and sinus problems.  Hematologic/Lymphatic:   Patient denies swollen glands and easy bruising.  Cardiovascular:   Patient denies leg swelling and chest pains.  Respiratory:   Patient denies cough and shortness of breath.  Endocrine:   Patient denies excessive thirst.  Musculoskeletal:   Patient reports back pain. Patient denies joint pain.  Neurological:   Patient denies headaches and dizziness.  Psychologic:   Patient denies depression and anxiety.   Notes: L flank pain  Reviewed previous review of systems 09/19/07. No Changes.   VITAL SIGNS:      08/21/2020 01:49 PM  Weight 262.5 lb / 119.07 kg  Height 79 in / 200.66 cm  BP 117/74 mmHg  Pulse 101 /min  Temperature 98.9 F / 37.1 C  BMI 29.6 kg/m   MULTI-SYSTEM PHYSICAL EXAMINATION:    Constitutional: Well-nourished. No physical deformities. Normally developed. Good grooming.  Neck: Neck symmetrical, not swollen. Normal tracheal position.  Respiratory: No labored breathing, no use of accessory muscles.   Cardiovascular: Normal temperature, normal extremity pulses, no swelling, no varicosities.  Skin: No paleness, no jaundice, no cyanosis. No lesion, no ulcer, no rash.  Neurologic / Psychiatric: Oriented to time, oriented to place, oriented to person. No depression, no anxiety, no agitation.  Musculoskeletal: Normal gait and station of head and neck.     PAST DATA REVIEW: None   PROCEDURES:         KUB - [id number]  A single view of the abdomen is obtained. KUB today the previously identified left upper pole calculi is not easily seen even on in versus imaging as it was previously noted adjacent to and slightly underlying the left 12th rib. Previously identified left renal pelvis/UPJ calculus has changed somewhat in complexity and  characteristics but a 9 mm opacity does remain within the confines of the expected location of the renal pelvis in the mid to lower pole area. A smaller nonobstructing stone is seen just lateral to this. Anatomical expected tract of the left ureter grossly appears clear. He has stable left-sided pelvic phleboliths. The bladder grossly appears free of obstruction.      Patient confirmed No Neulasta OnPro Device.           Urinalysis w/Scope Dipstick Dipstick Cont'd Micro  Color: Yellow Bilirubin: Neg mg/dL WBC/hpf: 0 - 5/hpf  Appearance: Clear Ketones: Neg mg/dL RBC/hpf: 0 - 2/hpf  Specific Gravity: 1.025 Blood: Trace ery/uL Bacteria: Few (10-25/hpf)  pH: 6.5 Protein: Trace mg/dL Cystals: NS (Not Seen)  Glucose: Neg mg/dL Urobilinogen: 0.2 mg/dL Casts: NS (Not Seen)    Nitrites: Neg Trichomonas: Not Present    Leukocyte Esterase: Neg leu/uL Mucous: Not Present      Epithelial Cells: NS (Not Seen)      Yeast: NS (Not Seen)      Sperm: Not Present    ASSESSMENT:      ICD-10 Details  1 GU:   Renal calculus - N20.0  PLAN:            Medications New Meds: Tamsulosin Hcl 0.4 mg capsule 1 capsule PO Daily   #30  2 Refill(s)  Oxycodone-Acetaminophen 5 mg-325 mg tablet 1 tablet PO Q 6 H PRN   #15  0 Refill(s)            Orders Labs Stone Analysis, Urine Culture          Schedule Return Visit/Planned Activity: Other See Visit Notes - Follow up MD, Schedule Surgery, F/U Pending Results          Document Letter(s):  Created for Patient: Clinical Summary         Notes:   He has passed a considerable amount of stone material since recent shockwave lithotripsy. On today's study, I do not definitively see the previously noted upper pole calculus which makes me question the fact that this may have either transition from its previous location in the left upper pole to the renal pelvis or the previously identified left UPJ/renal pelvis stone has just not completely fragmented and passed.  Again the previously identified upper pole stone may simply be obscured due to overlying bony structures. He continues to be intermittently symptomatic with pain and discomfort in the left lower back or flank. Voiding symptoms are grossly stable but he does have intermittent periods of frequency/urgency and painful/burning urination. He would like to proceed with additional treatment in the form of shockwave lithotripsy as soon as possible. I will discuss today's presentation with Dr Louis Meckel who last evaluated him here in clinic. I may ultimately have to defer to Dr. Gilford Rile recommendation as he is his noted primary urologist. Given the fact he remains intermittently symptomatic, I will go ahead and refill tamsulosin as well as pain medication today to have in case he has worsening symptomatology over the upcoming holiday weekend and into the next couple of weeks. Precautionary urine culture sent as well. Appropriate emergency department follow-up instructions as well as return to clinic instructions discussed in detail for new or worsening symptomatology. Once I have discussed with the urologists as noted above, the patient will be contacted and scheduled appropriately.   [place] control substance database reporting System assessed prior to prescribing pain medication today. He was last prescribed pain medication at time of recent shockwave lithotripsy on 12/09.

## 2020-09-01 NOTE — Op Note (Signed)
POST-OPERATIVE DIAGNOSIS:  * SAME *  PROCEDURE:  Procedure(s): LEFT EXTRACORPOREAL SHOCK WAVE LITHOTRIPSY (ESWL) (Left)  SURGEON:  Surgeon(s) and Role:    * Belva Agee, MD - Primary  PHYSICIAN ASSISTANT:   ASSISTANTS: none   ANESTHESIA:   IV sedation  EBL:  minimal   BLOOD ADMINISTERED:none  DRAINS: none   LOCAL MEDICATIONS USED:  NONE  SPECIMEN:  No Specimen  DISPOSITION OF SPECIMEN:  N/A  COUNTS:  YES  TOURNIQUET:  * No tourniquets in log *  DICTATION: .Note written in EPIC  PLAN OF CARE: Discharge to home after PACU  PATIENT DISPOSITION:  PACU - hemodynamically stable.   Delay start of Pharmacological VTE agent (>24hrs) due to surgical blood loss or risk of bleeding: not applicable

## 2020-09-01 NOTE — Brief Op Note (Signed)
09/01/2020  10:24 AM  PATIENT:  Andres Thompson  45 y.o. male  PRE-OPERATIVE DIAGNOSIS:  LEFT RENAL STONE  POST-OPERATIVE DIAGNOSIS:  * SAME *  PROCEDURE:  Procedure(s): LEFT EXTRACORPOREAL SHOCK WAVE LITHOTRIPSY (ESWL) (Left)  SURGEON:  Surgeon(s) and Role:    * Belva Agee, MD - Primary  PHYSICIAN ASSISTANT:   ASSISTANTS: none   ANESTHESIA:   IV sedation  EBL:  minimal   BLOOD ADMINISTERED:none  DRAINS: none   LOCAL MEDICATIONS USED:  NONE  SPECIMEN:  No Specimen  DISPOSITION OF SPECIMEN:  N/A  COUNTS:  YES  TOURNIQUET:  * No tourniquets in log *  DICTATION: .Note written in EPIC  PLAN OF CARE: Discharge to home after PACU  PATIENT DISPOSITION:  PACU - hemodynamically stable.   Delay start of Pharmacological VTE agent (>24hrs) due to surgical blood loss or risk of bleeding: not applicable

## 2020-09-01 NOTE — Interval H&P Note (Signed)
History and Physical Interval Note:  09/01/2020 7:58 AM  Andres Maduro L Shaker Thompson  has presented today for surgery, with the diagnosis of LEFT RENAL STONE.  The various methods of treatment have been discussed with the patient and family. After consideration of risks, benefits and other options for treatment, the patient has consented to  Procedure(s): LEFT EXTRACORPOREAL SHOCK WAVE LITHOTRIPSY (ESWL) (Left) as a surgical intervention.  The patient's history has been reviewed, patient examined, no change in status, stable for surgery.  I have reviewed the patient's chart and labs.  Questions were answered to the patient's satisfaction.     Belva Agee

## 2020-09-02 ENCOUNTER — Encounter (HOSPITAL_BASED_OUTPATIENT_CLINIC_OR_DEPARTMENT_OTHER): Payer: Self-pay | Admitting: Urology

## 2020-12-26 ENCOUNTER — Other Ambulatory Visit: Payer: Self-pay | Admitting: Urology

## 2020-12-26 DIAGNOSIS — N200 Calculus of kidney: Secondary | ICD-10-CM

## 2021-01-01 ENCOUNTER — Other Ambulatory Visit (HOSPITAL_COMMUNITY)
Admission: RE | Admit: 2021-01-01 | Discharge: 2021-01-01 | Disposition: A | Discharge status: Home / Self Care | Payer: BC Managed Care – PPO | Source: Ambulatory Visit | Attending: Urology | Admitting: Urology

## 2021-01-01 DIAGNOSIS — Z20822 Contact with and (suspected) exposure to covid-19: Secondary | ICD-10-CM | POA: Insufficient documentation

## 2021-01-01 DIAGNOSIS — Z01812 Encounter for preprocedural laboratory examination: Secondary | ICD-10-CM | POA: Insufficient documentation

## 2021-01-01 NOTE — Progress Notes (Signed)
Pre op phone call completed.  Pt to arrive at 0600. NPO after midnight.

## 2021-01-02 LAB — SARS CORONAVIRUS 2 (TAT 6-24 HRS): SARS Coronavirus 2: NEGATIVE

## 2021-01-05 ENCOUNTER — Other Ambulatory Visit: Payer: Self-pay

## 2021-01-05 ENCOUNTER — Ambulatory Visit (HOSPITAL_BASED_OUTPATIENT_CLINIC_OR_DEPARTMENT_OTHER)
Admission: RE | Admit: 2021-01-05 | Discharge: 2021-01-05 | Disposition: A | Discharge status: Home / Self Care | Payer: BC Managed Care – PPO | Attending: Urology | Admitting: Urology

## 2021-01-05 ENCOUNTER — Encounter (HOSPITAL_BASED_OUTPATIENT_CLINIC_OR_DEPARTMENT_OTHER): Payer: Self-pay | Admitting: Urology

## 2021-01-05 ENCOUNTER — Ambulatory Visit (HOSPITAL_COMMUNITY): Payer: BC Managed Care – PPO

## 2021-01-05 ENCOUNTER — Encounter (HOSPITAL_BASED_OUTPATIENT_CLINIC_OR_DEPARTMENT_OTHER)
Admission: RE | Disposition: A | Discharge status: Home / Self Care | Payer: Self-pay | Source: Home / Self Care | Attending: Urology

## 2021-01-05 DIAGNOSIS — K219 Gastro-esophageal reflux disease without esophagitis: Secondary | ICD-10-CM | POA: Insufficient documentation

## 2021-01-05 DIAGNOSIS — N200 Calculus of kidney: Secondary | ICD-10-CM | POA: Diagnosis not present

## 2021-01-05 DIAGNOSIS — Z87442 Personal history of urinary calculi: Secondary | ICD-10-CM | POA: Insufficient documentation

## 2021-01-05 HISTORY — PX: EXTRACORPOREAL SHOCK WAVE LITHOTRIPSY: SHX1557

## 2021-01-05 SURGERY — LITHOTRIPSY, ESWL
Anesthesia: LOCAL | Laterality: Left

## 2021-01-05 MED ORDER — DIAZEPAM 5 MG PO TABS
10.0000 mg | ORAL_TABLET | ORAL | Status: AC
  Administered 2021-01-05: 10 mg via ORAL

## 2021-01-05 MED ORDER — DIPHENHYDRAMINE HCL 25 MG PO CAPS
25.0000 mg | ORAL_CAPSULE | ORAL | Status: AC
  Administered 2021-01-05: 25 mg via ORAL

## 2021-01-05 MED ORDER — OXYCODONE-ACETAMINOPHEN 5-325 MG PO TABS: 1.0000 | ORAL_TABLET | ORAL | 0 refills | Status: AC | PRN

## 2021-01-05 MED ORDER — SODIUM CHLORIDE 0.9 % IV SOLN: INTRAVENOUS | Status: DC

## 2021-01-05 MED ORDER — DIPHENHYDRAMINE HCL 25 MG PO CAPS
ORAL_CAPSULE | ORAL | Status: AC
  Filled 2021-01-05: qty 1

## 2021-01-05 MED ORDER — CIPROFLOXACIN HCL 500 MG PO TABS
500.0000 mg | ORAL_TABLET | ORAL | Status: AC
  Administered 2021-01-05: 500 mg via ORAL

## 2021-01-05 MED ORDER — CIPROFLOXACIN HCL 500 MG PO TABS
ORAL_TABLET | ORAL | Status: AC
  Filled 2021-01-05: qty 1

## 2021-01-05 MED ORDER — DIAZEPAM 5 MG PO TABS
ORAL_TABLET | ORAL | Status: AC
  Filled 2021-01-05: qty 2

## 2021-01-05 NOTE — Discharge Instructions (Signed)
Activity:  You are encouraged to ambulate frequently (about every hour during waking hours) to help prevent blood clots from forming in your legs or lungs.   ° °Diet: You should advance your diet as instructed by your physician.  It will be normal to have some bloating, nausea, and abdominal discomfort intermittently. ° °Prescriptions:  You will be provided a prescription for pain medication to take as needed.  If your pain is not severe enough to require the prescription pain medication, you may take extra strength Tylenol instead which will have less side effects.  You should also take a prescribed stool softener to avoid straining with bowel movements as the prescription pain medication may constipate you. ° °What to call us about: You should call the office ([phone]) if you develop fever > 101 or develop persistent vomiting. Activity:  You are encouraged to ambulate frequently (about every hour during waking hours) to help prevent blood clots from forming in your legs or lungs.  °Post Anesthesia Home Care Instructions ° °Activity: °Get plenty of rest for the remainder of the day. A responsible adult should stay with you for 24 hours following the procedure.  °For the next 24 hours, DO NOT: °-Drive a car °-Operate machinery °-Drink alcoholic beverages °-Take any medication unless instructed by your physician °-Make any legal decisions or sign important papers. ° °Meals: °Start with liquid foods such as gelatin or soup. Progress to regular foods as tolerated. Avoid greasy, spicy, heavy foods. If nausea and/or vomiting occur, drink only clear liquids until the nausea and/or vomiting subsides. Call your physician if vomiting continues. ° °Special Instructions/Symptoms: °Your throat may feel dry or sore from the anesthesia or the breathing tube placed in your throat during surgery. If this causes discomfort, gargle with warm salt water. The discomfort should disappear within 24 hours. ° °If you had a scopolamine  patch placed behind your ear for the management of post- operative nausea and/or vomiting: ° °1. The medication in the patch is effective for 72 hours, after which it should be removed.  Wrap patch in a tissue and discard in the trash. Wash hands thoroughly with soap and water. °2. You may remove the patch earlier than 72 hours if you experience unpleasant side effects which may include dry mouth, dizziness or visual disturbances. °3. Avoid touching the patch. Wash your hands with soap and water after contact with the patch. °   °

## 2021-01-05 NOTE — H&P (Signed)
Urology Preoperative H&P   Chief Complaint: Left renal stone  History of Present Illness: Andres Thompson is a 45 y.o. male with a 29mm left renal stone here for L ESWL. Denies fevers, chills, dysuria.    Past Medical History:  Diagnosis Date  . GERD (gastroesophageal reflux disease)   . History of kidney stones   . Left ureteral stone   . MVP (mitral valve prolapse)    03-20-2020  per pt dx approx. 1990s, stated had echo,  denies any symptoms  (no cardiologist)  . PONV (postoperative nausea and vomiting)    severe  . Wears contact lenses     Past Surgical History:  Procedure Laterality Date  . CYSTOSCOPY WITH RETROGRADE PYELOGRAM, URETEROSCOPY AND STENT PLACEMENT Bilateral 11/21/2019   Procedure: CYSTOSCOPY WITH RETROGRADE PYELOGRAM, URETEROSCOPY WITH HOLMIUM LASERAND STENT PLACEMENT;  Surgeon: Rene Paci, MD;  Location: [facility];  Service: Urology;  Laterality: Bilateral;  . EXTRACORPOREAL SHOCK WAVE LITHOTRIPSY  07/2019  . EXTRACORPOREAL SHOCK WAVE LITHOTRIPSY Left 02/25/2020   Procedure: EXTRACORPOREAL SHOCK WAVE LITHOTRIPSY (ESWL);  Surgeon: Bjorn Pippin, MD;  Location: [facility];  Service: Urology;  Laterality: Left;  . EXTRACORPOREAL SHOCK WAVE LITHOTRIPSY Left 08/07/2020   Procedure: EXTRACORPOREAL SHOCK WAVE LITHOTRIPSY (ESWL);  Surgeon: Belva Agee, MD;  Location: [facility];  Service: Urology;  Laterality: Left;  . EXTRACORPOREAL SHOCK WAVE LITHOTRIPSY Left 09/01/2020   Procedure: LEFT EXTRACORPOREAL SHOCK WAVE LITHOTRIPSY (ESWL);  Surgeon: Belva Agee, MD;  Location: [facility];  Service: Urology;  Laterality: Left;  . KNEE ARTHROSCOPY W/ MENISCAL REPAIR Left 1998  . ORIF FOOT FRACTURE Left 1990s   hardware removed 6 month's later  . URETEROSCOPY WITH HOLMIUM LASER LITHOTRIPSY  x6  from july 2020 to 02/ 2021    Allergies: No Known Allergies  History reviewed. No pertinent  family history.  Social History:  reports that he has never smoked. He has never used smokeless tobacco. He reports previous alcohol use. He reports that he does not use drugs.  ROS: A complete review of systems was performed.  All systems are negative except for pertinent findings as noted.  Physical Exam:  Vital signs in last 24 hours: Temp:  [97.8 F (36.6 C)] 97.8 F (36.6 C) (05/09 0628) Pulse Rate:  [64] 64 (05/09 0628) Resp:  [17] 17 (05/09 0628) BP: (131)/(80) 131/80 (05/09 0628) SpO2:  [99 %] 99 % (05/09 0628) Weight:  [116.1 kg] 116.1 kg (05/09 0628) Constitutional:  Alert and oriented, No acute distress Cardiovascular: Regular rate and rhythm Respiratory: Normal respiratory effort, Lungs clear bilaterally GI: Abdomen is soft, nontender, nondistended, no abdominal masses GU: No CVA tenderness Lymphatic: No lymphadenopathy Neurologic: Grossly intact, no focal deficits Psychiatric: Normal mood and affect  Laboratory Data:  No results for input(s): WBC, HGB, HCT, PLT in the last 72 hours.  No results for input(s): NA, K, CL, GLUCOSE, BUN, CALCIUM, CREATININE in the last 72 hours.  Invalid input(s): CO3   No results found for this or any previous visit (from the past 24 hour(s)). Recent Results (from the past 240 hour(s))  SARS CORONAVIRUS 2 (TAT 6-24 HRS) Nasopharyngeal Nasopharyngeal Swab     Status: None   Collection Time: 01/01/21  9:50 AM   Specimen: Nasopharyngeal Swab  Result Value Ref Range Status   SARS Coronavirus 2 NEGATIVE NEGATIVE Final    Comment: (NOTE) SARS-CoV-2 target nucleic acids are NOT DETECTED.  The SARS-CoV-2 RNA is  generally detectable in upper and lower respiratory specimens during the acute phase of infection. Negative results do not preclude SARS-CoV-2 infection, do not rule out co-infections with other pathogens, and should not be used as the sole basis for treatment or other patient management decisions. Negative results must be  combined with clinical observations, patient history, and epidemiological information. The expected result is Negative.  Fact Sheet for Patients: HairSlick.no  Fact Sheet for Healthcare Providers: quierodirigir.com  This test is not yet approved or cleared by the [place] FDA and  has been authorized for detection and/or diagnosis of SARS-CoV-2 by FDA under an Emergency Use Authorization (EUA). This EUA will remain  in effect (meaning this test can be used) for the duration of the COVID-19 declaration under Se ction 564(b)(1) of the Act, 21 U.S.C. section 360bbb-3(b)(1), unless the authorization is terminated or revoked sooner.  Performed at [facility], [address], [place], [place] [address]     Renal Function: No results for input(s): CREATININE in the last 168 hours. CrCl cannot be calculated (No successful lab value found.).  Radiologic Imaging: No results found.  I independently reviewed the above imaging studies.  Assessment and Plan Andres Thompson is a 45 y.o. male with a 56mm L lower pole renal stone here for ESWL.   The risks, benefits and alternatives of left ESWL was discussed with the patient. I described the risks which include arrhythmia, kidney contusion, kidney hemorrhage, need for transfusion, back discomfort, flank ecchymosis, flank abrasion, inability to fracture the stone, inability to pass stone fragments, Steinstrasse, infection associated with obstructing stones, need for an alternative surgical procedure and possible need for repeat shockwave lithotripsy.  The patient voices understanding and wishes to proceed.    Matt R. Mykai Wendorf MD 01/05/2021, 7:27 AM  [facility] Pager: [phone]): [phone]

## 2021-01-05 NOTE — Op Note (Signed)
ESWL Operative Note  Treating Physician: Jettie Pagan, MD  Pre-op diagnosis: Left lower pole renal stone  Post-op diagnosis: Same   Procedure: Left ESWL  See [facility] OP note scanned into chart. Also because of the size, density, location and other factors that cannot be anticipated I feel this will likely be a staged procedure. This fact supersedes any indication in the scanned [facility] operative note to the contrary.  Matt R. Tommye Lehenbauer MD [facility]  Pager: [phone]

## 2021-01-06 ENCOUNTER — Encounter (HOSPITAL_BASED_OUTPATIENT_CLINIC_OR_DEPARTMENT_OTHER): Payer: Self-pay | Admitting: Urology

## 2021-06-29 IMAGING — DX DG ABDOMEN 1V
2 series · 2 of 2 positions shown · non-contrast
Comparison: 08/05/2020 and prior

CLINICAL DATA: Left-sided stone

EXAM:
ABDOMEN - 1 VIEW

[abdomen kub (1 of 2)]
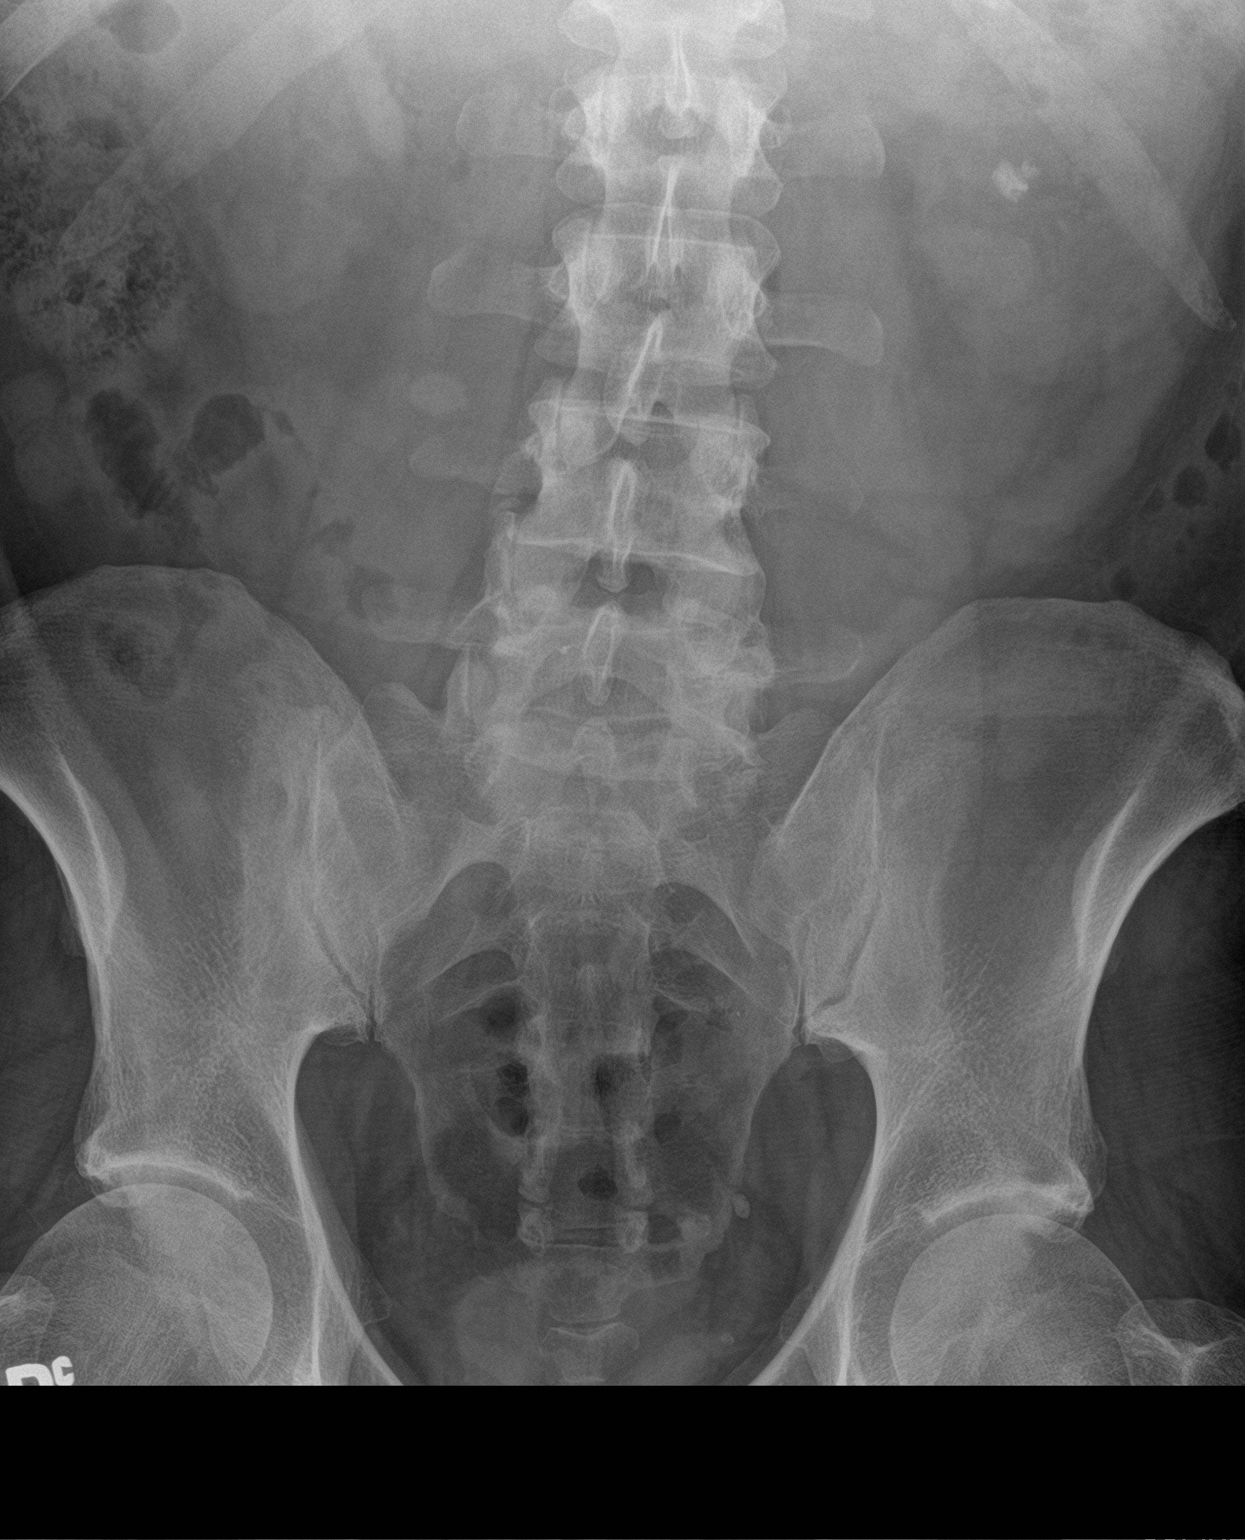

[abdomen kub (2 of 2)]
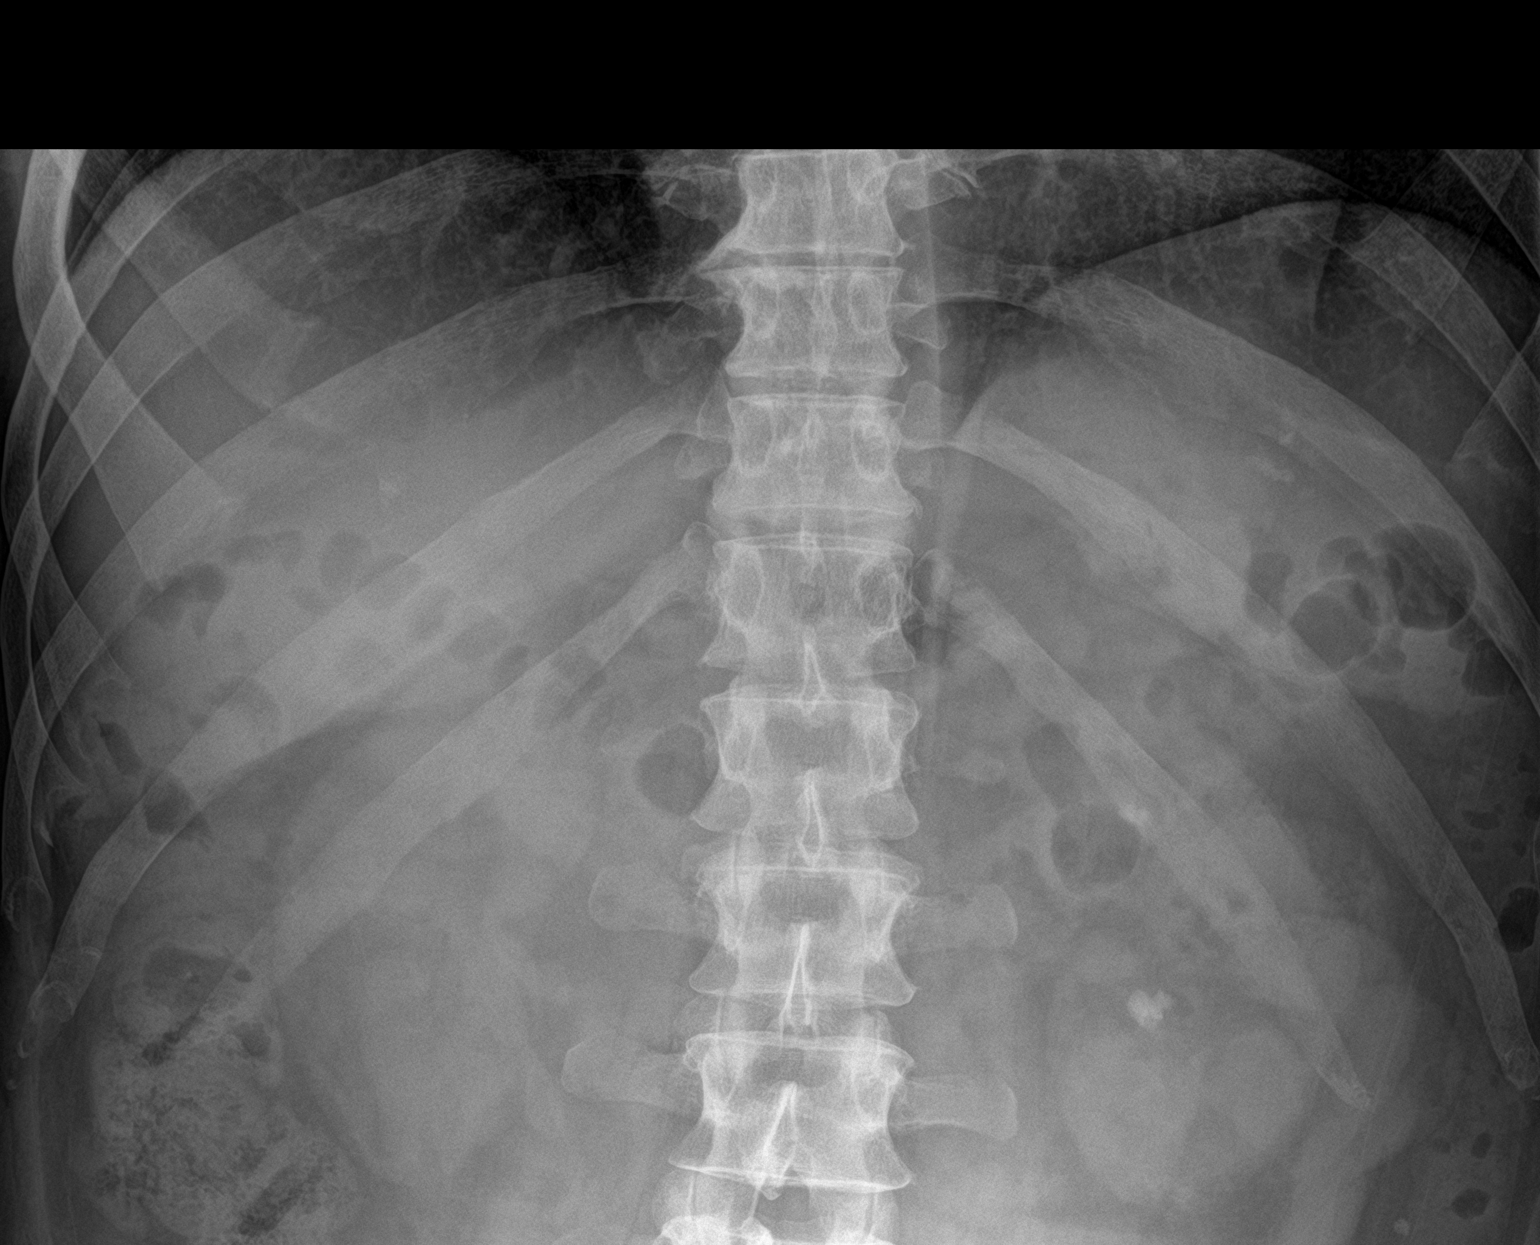

[2 of 2 positions shown; findings below may reference images not displayed]

FINDINGS: 0.9 cm calcific density overlying the inferior left renal shadow is
unchanged. 6 mm left superior pole radiodensity is also unchanged.
Rounded left hemipelvic calcific densities, likely phleboliths.
Nonobstructive bowel gas pattern. Mild stool burden. No acute
osseous abnormality.
IMPRESSION: Left nephrolithiasis as detailed above.

## 2021-07-24 IMAGING — DX DG ABDOMEN 1V
2 series · 2 of 2 positions shown · non-contrast
Comparison: 08/21/2020

CLINICAL DATA: Preprocedural radiograph prior to left-sided
lithotripsy.

EXAM:
ABDOMEN - 1 VIEW

[abdomen kub (1 of 2)]
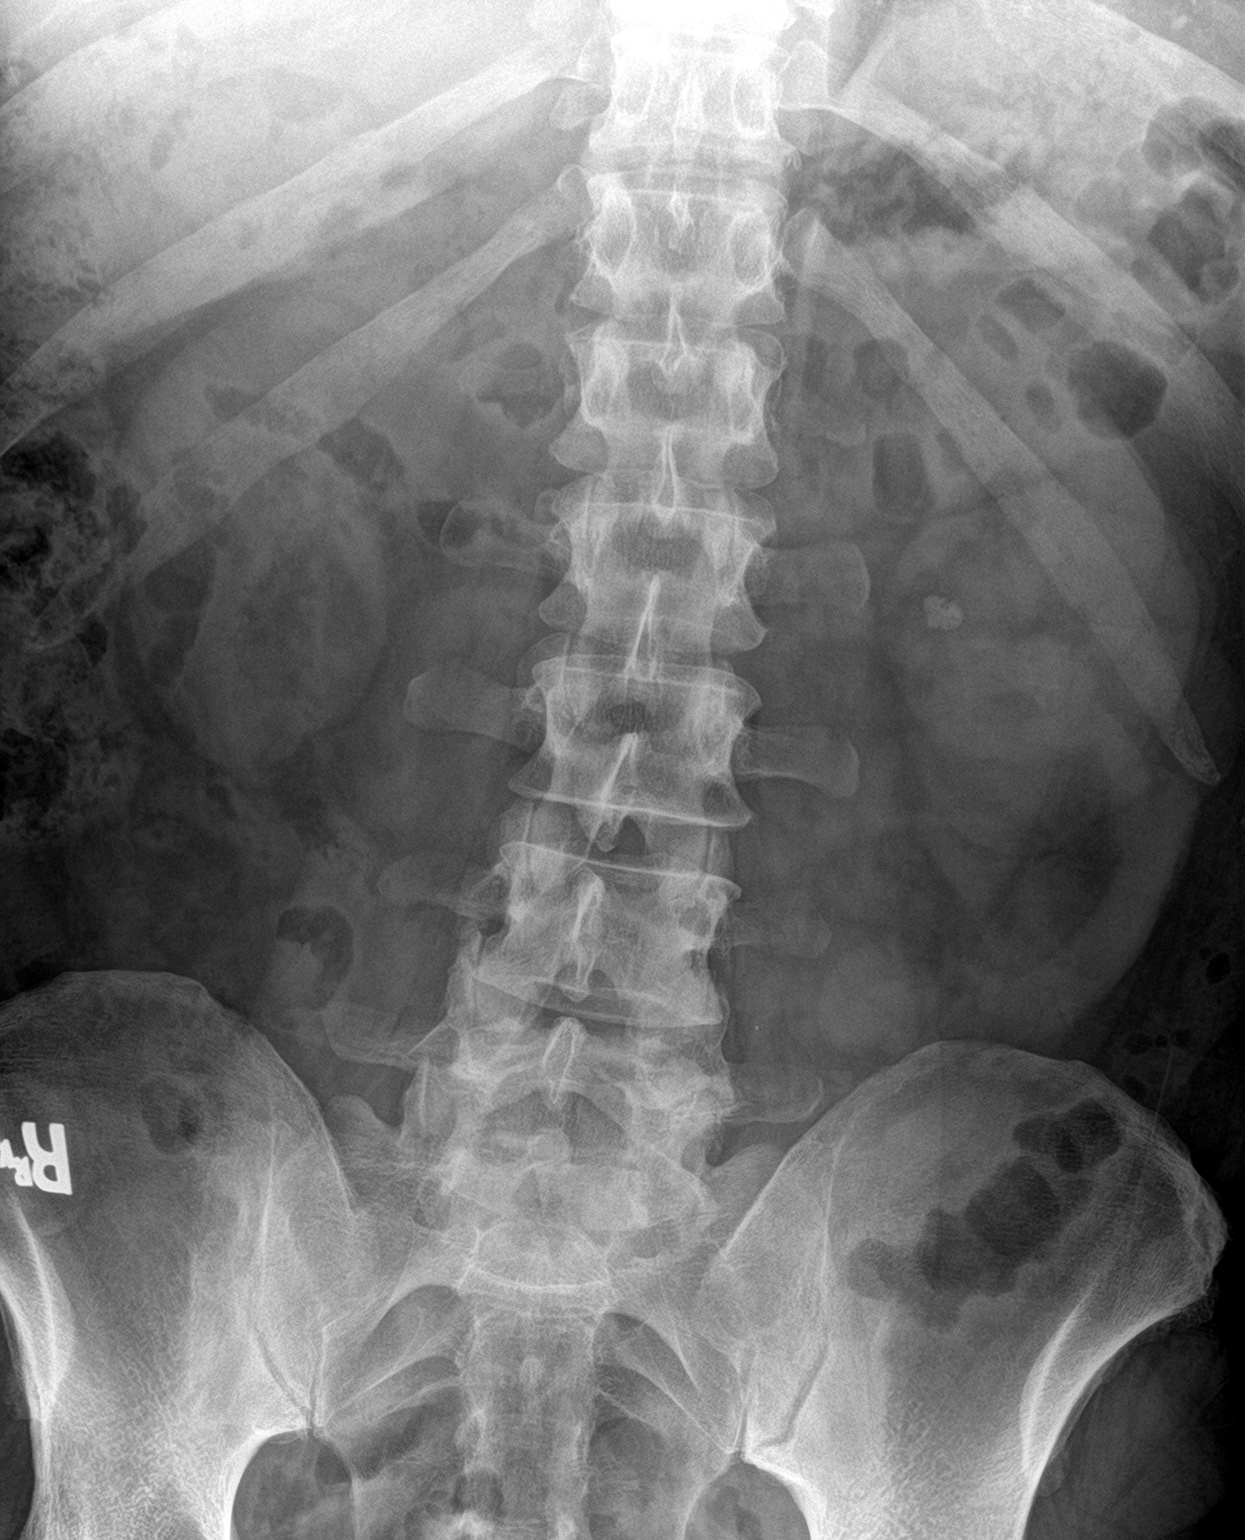

[abdomen kub (2 of 2)]
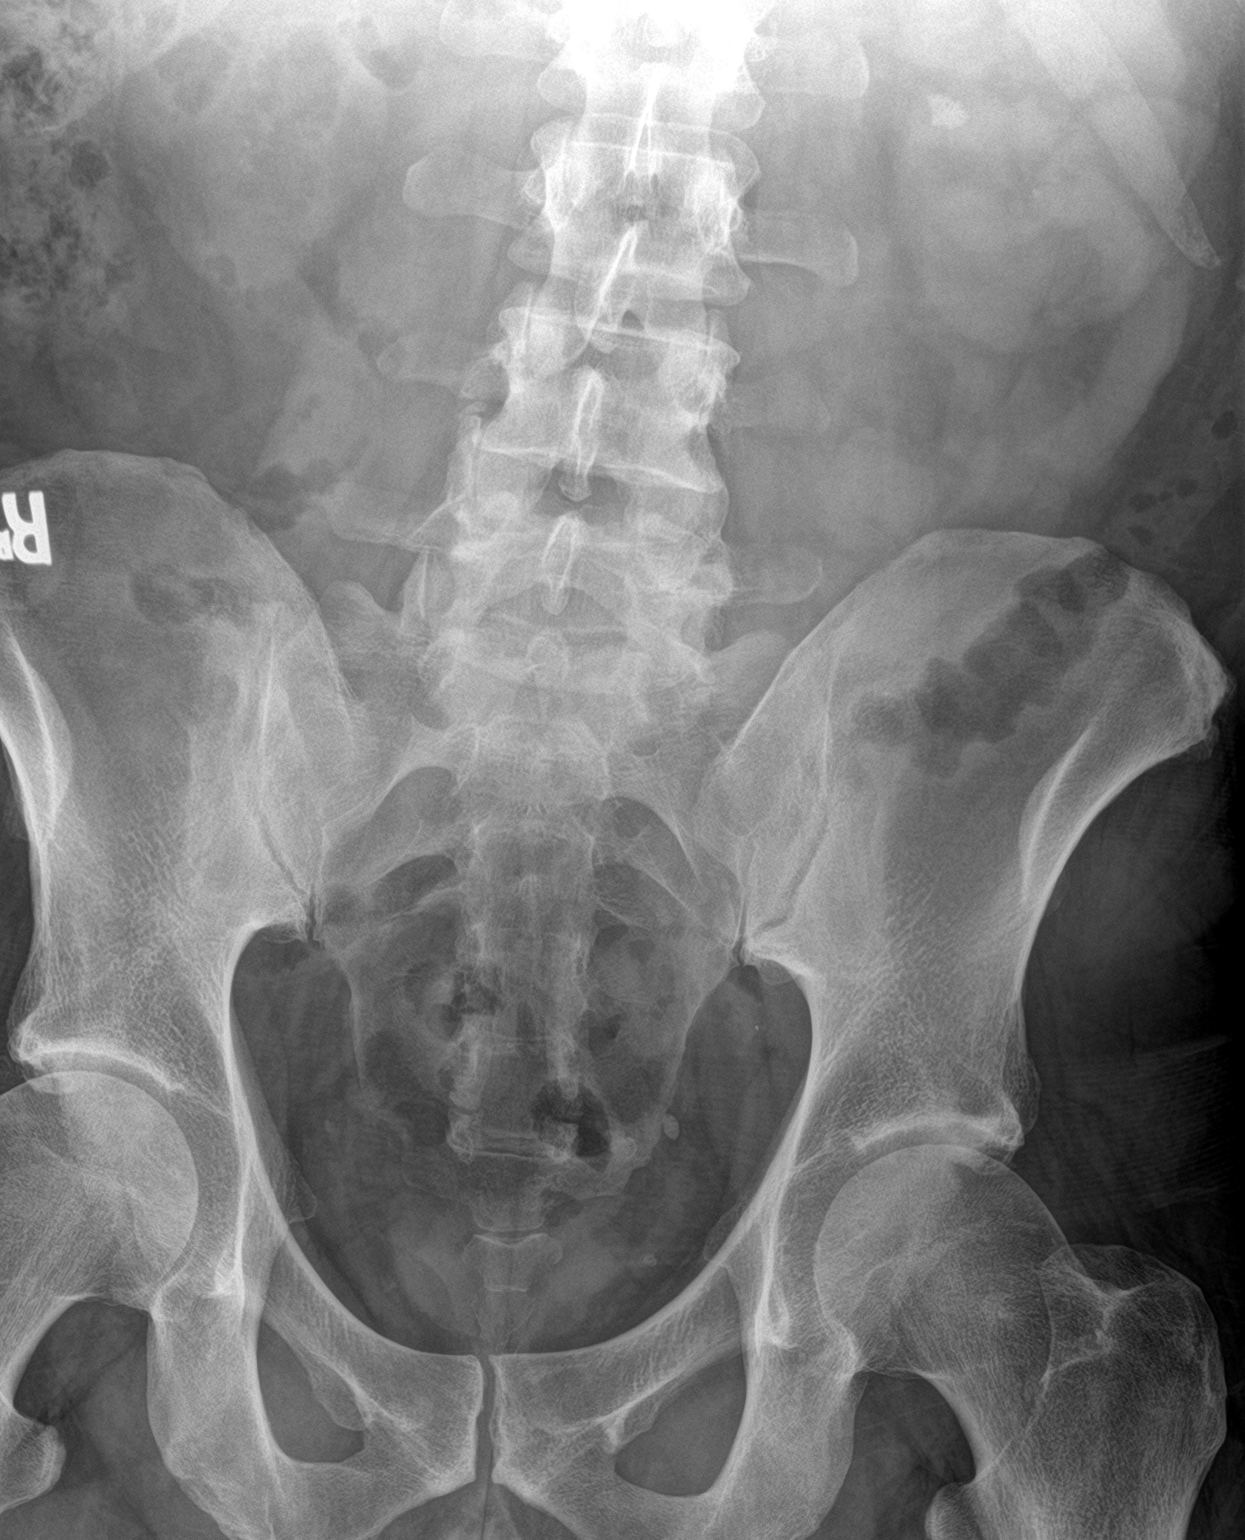

[2 of 2 positions shown; findings below may reference images not displayed]

FINDINGS: Calculus projecting over the midportion of the left kidney measures
approximately 8 x 11 mm. No other abnormal calcifications are seen
overlying the kidneys or ureters. Bowel gas pattern is unremarkable.
Visualized bony structures are normal.
IMPRESSION: 8 x 11 mm left renal calculus.

## 2021-09-15 ENCOUNTER — Other Ambulatory Visit: Payer: Self-pay | Admitting: Urology

## 2021-09-16 NOTE — Progress Notes (Signed)
09/16/2021 5:09 PM Pt. Updated on arrival time of 0645, NPO at [facility] and clears at 0445. Pt. Verbalized understanding.  Andres Thompson, Blanchard Kelch

## 2021-09-16 NOTE — Progress Notes (Signed)
Patient to arrive at 0915 on 09/17/2021. History and medications reviewed. Pre-procedure instructions given. NPO after 0515 on day of procedure except for clear liquids until 0715. Driver secured.

## 2021-09-17 ENCOUNTER — Encounter (HOSPITAL_BASED_OUTPATIENT_CLINIC_OR_DEPARTMENT_OTHER): Payer: Self-pay | Admitting: Urology

## 2021-09-17 ENCOUNTER — Encounter (HOSPITAL_BASED_OUTPATIENT_CLINIC_OR_DEPARTMENT_OTHER)
Admission: RE | Disposition: A | Discharge status: Home / Self Care | Payer: Self-pay | Source: Home / Self Care | Attending: Urology

## 2021-09-17 ENCOUNTER — Ambulatory Visit (HOSPITAL_BASED_OUTPATIENT_CLINIC_OR_DEPARTMENT_OTHER)
Admission: RE | Admit: 2021-09-17 | Discharge: 2021-09-17 | Disposition: A | Discharge status: Home / Self Care | Payer: BC Managed Care – PPO | Attending: Urology | Admitting: Urology

## 2021-09-17 ENCOUNTER — Ambulatory Visit (HOSPITAL_COMMUNITY): Payer: BC Managed Care – PPO

## 2021-09-17 DIAGNOSIS — N200 Calculus of kidney: Secondary | ICD-10-CM | POA: Diagnosis present

## 2021-09-17 DIAGNOSIS — Z01818 Encounter for other preprocedural examination: Secondary | ICD-10-CM

## 2021-09-17 HISTORY — PX: EXTRACORPOREAL SHOCK WAVE LITHOTRIPSY: SHX1557

## 2021-09-17 SURGERY — LITHOTRIPSY, ESWL
Anesthesia: LOCAL | Laterality: Left

## 2021-09-17 MED ORDER — CIPROFLOXACIN HCL 500 MG PO TABS
500.0000 mg | ORAL_TABLET | Freq: Once | ORAL | Status: AC
  Administered 2021-09-17: 500 mg via ORAL

## 2021-09-17 MED ORDER — DIAZEPAM 5 MG PO TABS
ORAL_TABLET | ORAL | Status: AC
  Filled 2021-09-17: qty 2

## 2021-09-17 MED ORDER — DIAZEPAM 5 MG PO TABS
10.0000 mg | ORAL_TABLET | Freq: Once | ORAL | Status: AC
  Administered 2021-09-17: 10 mg via ORAL

## 2021-09-17 MED ORDER — ONDANSETRON HCL 4 MG PO TABS: 4.0000 mg | ORAL_TABLET | Freq: Four times a day (QID) | ORAL | 1 refills | Status: DC | PRN

## 2021-09-17 MED ORDER — CIPROFLOXACIN HCL 500 MG PO TABS
ORAL_TABLET | ORAL | Status: AC
  Filled 2021-09-17: qty 1

## 2021-09-17 MED ORDER — OXYCODONE-ACETAMINOPHEN 5-325 MG PO TABS: 1.0000 | ORAL_TABLET | Freq: Four times a day (QID) | ORAL | 0 refills | Status: DC | PRN

## 2021-09-17 MED ORDER — DIPHENHYDRAMINE HCL 25 MG PO CAPS
ORAL_CAPSULE | ORAL | Status: AC
  Filled 2021-09-17: qty 1

## 2021-09-17 MED ORDER — DIPHENHYDRAMINE HCL 25 MG PO CAPS
25.0000 mg | ORAL_CAPSULE | Freq: Once | ORAL | Status: AC
  Administered 2021-09-17: 25 mg via ORAL

## 2021-09-17 MED ORDER — DOCUSATE SODIUM 100 MG PO CAPS: 100.0000 mg | ORAL_CAPSULE | Freq: Every day | ORAL | 0 refills | Status: DC | PRN

## 2021-09-17 MED ORDER — SODIUM CHLORIDE 0.9 % IV SOLN: INTRAVENOUS | Status: DC

## 2021-09-17 MED ORDER — TAMSULOSIN HCL 0.4 MG PO CAPS: 0.4000 mg | ORAL_CAPSULE | Freq: Two times a day (BID) | ORAL | 1 refills | Status: DC

## 2021-09-17 NOTE — Discharge Instructions (Signed)

## 2021-09-17 NOTE — H&P (Signed)
Urology Preoperative H&P   Chief Complaint: Left renal stone  History of Present Illness: Andres Thompson is a 46 y.o. male with a left renal stone here for left ESWL. Denies fevers, chills, dysuria.    Past Medical History:  Diagnosis Date   GERD (gastroesophageal reflux disease)    History of kidney stones    Left ureteral stone    MVP (mitral valve prolapse)    03-20-2020  per pt dx approx. 1990s, stated had echo,  denies any symptoms  (no cardiologist)   PONV (postoperative nausea and vomiting)    severe   Wears contact lenses     Past Surgical History:  Procedure Laterality Date   CYSTOSCOPY WITH RETROGRADE PYELOGRAM, URETEROSCOPY AND STENT PLACEMENT Bilateral 11/21/2019   Procedure: CYSTOSCOPY WITH RETROGRADE PYELOGRAM, URETEROSCOPY WITH HOLMIUM LASERAND STENT PLACEMENT;  Surgeon: Rene Paci, MD;  Location: [facility];  Service: Urology;  Laterality: Bilateral;   EXTRACORPOREAL SHOCK WAVE LITHOTRIPSY  07/2019   EXTRACORPOREAL SHOCK WAVE LITHOTRIPSY Left 02/25/2020   Procedure: EXTRACORPOREAL SHOCK WAVE LITHOTRIPSY (ESWL);  Surgeon: Bjorn Pippin, MD;  Location: [facility];  Service: Urology;  Laterality: Left;   EXTRACORPOREAL SHOCK WAVE LITHOTRIPSY Left 08/07/2020   Procedure: EXTRACORPOREAL SHOCK WAVE LITHOTRIPSY (ESWL);  Surgeon: Belva Agee, MD;  Location: [facility];  Service: Urology;  Laterality: Left;   EXTRACORPOREAL SHOCK WAVE LITHOTRIPSY Left 09/01/2020   Procedure: LEFT EXTRACORPOREAL SHOCK WAVE LITHOTRIPSY (ESWL);  Surgeon: Belva Agee, MD;  Location: [facility];  Service: Urology;  Laterality: Left;   EXTRACORPOREAL SHOCK WAVE LITHOTRIPSY Left 01/05/2021   Procedure: EXTRACORPOREAL SHOCK WAVE LITHOTRIPSY (ESWL);  Surgeon: Jannifer Hick, MD;  Location: [facility];  Service: Urology;  Laterality: Left;   KNEE ARTHROSCOPY W/ MENISCAL REPAIR Left 1998   ORIF  FOOT FRACTURE Left 1990s   hardware removed 6 month's later   URETEROSCOPY WITH HOLMIUM LASER LITHOTRIPSY  x6  from july 2020 to 02/ 2021    Allergies: No Known Allergies  History reviewed. No pertinent family history.  Social History:  reports that he has never smoked. He has never used smokeless tobacco. He reports that he does not currently use alcohol. He reports that he does not use drugs.  ROS: A complete review of systems was performed.  All systems are negative except for pertinent findings as noted.  Physical Exam:  Vital signs in last 24 hours: Temp:  [97.7 F (36.5 C)] 97.7 F (36.5 C) (01/19 0707) Pulse Rate:  [71] 71 (01/19 0707) Resp:  [15] 15 (01/19 0707) BP: (133)/(74) 133/74 (01/19 0707) SpO2:  [100 %] 100 % (01/19 0707) Weight:  [114.3 kg] 114.3 kg (01/19 0707) Constitutional:  Alert and oriented, No acute distress Cardiovascular: Regular rate and rhythm Respiratory: Normal respiratory effort, Lungs clear bilaterally GI: Abdomen is soft, nontender, nondistended, no abdominal masses GU: No CVA tenderness Lymphatic: No lymphadenopathy Neurologic: Grossly intact, no focal deficits Psychiatric: Normal mood and affect  Laboratory Data:  No results for input(s): WBC, HGB, HCT, PLT in the last 72 hours.  No results for input(s): NA, K, CL, GLUCOSE, BUN, CALCIUM, CREATININE in the last 72 hours.  Invalid input(s): CO3   No results found for this or any previous visit (from the past 24 hour(s)). No results found for this or any previous visit (from the past 240 hour(s)).  Renal Function: No results for input(s): CREATININE in the last 168 hours. CrCl cannot be  calculated (No successful lab value found.).  Radiologic Imaging: No results found.  I independently reviewed the above imaging studies.  Assessment and Plan Andres Thompson is a 46 y.o. male with a left renal stone here for left ESWL.  The risks, benefits and alternatives of left ESWL was  discussed with the patient. I described the risks which include arrhythmia, kidney contusion, kidney hemorrhage, need for transfusion, back discomfort, flank ecchymosis, flank abrasion, inability to fracture the stone, inability to pass stone fragments, Steinstrasse, infection associated with obstructing stones, need for an alternative surgical procedure and possible need for repeat shockwave lithotripsy.  The patient voices understanding and wishes to proceed.       Matt R. Everlie Eble MD 09/17/2021, 9:09 AM  [facility] Pager: [phone]): [phone]

## 2021-09-17 NOTE — Op Note (Signed)
ESWL Operative Note  Treating Physician: Kathyjo Briere, MD  Pre-op diagnosis: Left renal stone  Post-op diagnosis: Same   Procedure: Left ESWL  See [facility] OP note scanned into chart. Also because of the size, density, location and other factors that cannot be anticipated I feel this will likely be a staged procedure. This fact supersedes any indication in the scanned [facility] operative note to the contrary.  Matt R. Dontrelle Mazon MD [facility]  Pager: [phone]   

## 2021-09-18 ENCOUNTER — Encounter (HOSPITAL_BASED_OUTPATIENT_CLINIC_OR_DEPARTMENT_OTHER): Payer: Self-pay | Admitting: Urology

## 2022-05-18 ENCOUNTER — Other Ambulatory Visit: Payer: Self-pay | Admitting: Urology

## 2022-05-21 NOTE — H&P (Signed)
CC/HPI: Kidney stones   Mr. Vanderpol is a 46 year old male with a history of recurrent kidney stones. Previously treated with indapamide and k-cit. Underwent URS and ESWL in past. Referred to [facility] clinic   12/04/20: The patient was found to have a 6 mm RIGHT proximal stone with mild hydro on CT from 4/5 (seen at [facility]). He reports intermittent episodes of sharp right flank pain for the past 3 days. No n/v/f/c, dysuria or hematuria. BMP and CBC at [facility] were WNL.   12/18/2020: Returns today for follow-up exam with renal ultrasound and KUB. Now complaining of bilateral lower back and flank pain/discomfort. I will add bilateral renal ultrasound to assess for obstructive signs in each kidney. He thought he passed a stone earlier this week. Pain in the right side has improved but still is present. Now with worsening left lower back and flank pain/discomfort, mild-to-moderate in severity but not severe enough to warrant excessive pain medication use or emergency department follow-up. Denies bothersome increase in lower urinary tract symptoms. No changes in force of stream, burning or painful urination, gross hematuria. He remains afebrile without nausea or vomiting. US showed no hydro and KUB with LLP stone c/w stone seen on the CT.   12/25/2020: Pt added urgently on for renal US at [facility] 12/24/2020. Fortunately, this showed the 7 mm LLP stone and no hydro of either kidney. Urine is clear. He had left flank and noted red urine. He is well today. No fever.   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   01/19/2021: After last visit with the on-call urologist, Mr Kessenich was set up for ESWL to treat the previously identified left lower pole calculus. He follows up today in regards to this. Doing well today, outside of stone material passage he has not had severe exacerbation of left-sided pain and discomfort. He continues to void at his baseline with stable, non bothersome symptomatology.  He continues indapamide and potassium citrate as previously prescribed. He tells me he passed 4-5 large stone pieces after his procedure with resolution of previously endorsed pain after that. Urinalysis today without microscopic hematuria or infection signs.   09/11/2021: 46 year old male who presents today with left flank pain that radiates into his left lower quadrant and is associated with slight nausea. He was given oxycodone by the emergency department 1 week ago. He reports the pain has progressively worsened, and oxycodone is not fully relieving it. He has a KUB from [facility] that does show a new left lower pole stone and some left-sided pelvic phleboliths. He denies fevers, chills. He has had 1-2 episodes of gross hematuria over the last few days.   09/21/2020: The patient is here today after undergoing left ESWL on 09/17/2021 for a 1 cm renal stone. The patient reports intermittent episodes of left lower quadrant pain that is sharp in character and nonradiating. He denies nausea/vomiting, fever/chills, dysuria or hematuria. He has not seen any stone fragments passed yet.   10/01/2021: 46 year old male who presents today for follow-up. He reports his symptoms of left lower quadrant pain are improving. He denies interval passage of stone materials or fevers and chills. He denies gross hematuria.   05/14/2022: Patient with above-noted history. Seen yesterday at [facility] with symptoms of left-sided pain and discomfort suggestive of obstructive uropathy as well as gross hematuria. CT imaging obtained showed an approximately 7 mm left proximal ureteral calculi overlying the transverse process of left L5 lumbar vertebrae, easily seen  on CT scout imaging. No other additional renal or ureteral calculi were identified on CT exam.   Today he is in no acute distress. Few days ago he did have some hematuria as well as some increased frequency/urgency from baseline. Still having some mild  discomfort in the left lower quadrant of the abdomen but that is controlled. He is also had some nausea with the pain but no vomiting. Denies f/c. He is not having dysuria or gross hematuria. He went to the ED because he thought he was having a flare of diverticulitis due to the pain presentation in the left lower quadrant of the abdomen and groin.     ALLERGIES: No Known Allergies    MEDICATIONS: Chlorthalidone 25 mg tablet  Potassium Citrate Er     GU PSH: Cysto Uretero Lithotripsy ESWL - 09/17/2021, Left - 01/05/2021, Left - 2022, Left - 08/07/2020, Left - 2021 Ureteroscopic laser litho, Bilateral - 2021       PSH Notes: L foot Fx repair with hardware  L knee Meniscus repair    NON-GU PSH: None   GU PMH: Flank Pain - 10/01/2021, (Stable), - 09/21/2021, - 09/14/2021, - 09/11/2021, - 12/04/2020, - 2022, - 2022, - 2021 Renal calculus - 09/21/2021, - 09/11/2021, - 01/19/2021, He has left lower pole stone that is stable and non-obs although we discussed he could passed or passing a stone we cant see. We discussed risks of surv, left ESWL or left URS/stent. He would like to proceed with left ESWL. Which is reasonable and I will set it up. , - 12/25/2020, - 2022, - 2022, - 08/21/2020, - 07/15/2020, Recurrent stone disease-symptomatic, moderate sized left UPJ stone, - 2021, - 2021 Renal and ureteral calculus - 12/18/2020, - 2022, - 08/05/2020, - 2021 Ureteral calculus - 12/04/2020, - 08/05/2020, - 2021, - 2021 Ureteral obstruction secondary to calculous (Stable) - 12/04/2020, - 2021 Nocturia - 2021    NON-GU PMH: Pyuria/other UA findings - 07/15/2020 Cardiac murmur, unspecified GERD Phlebitis and thrombophlebitis of unspecified deep vessels of unspecified lower extremity    FAMILY HISTORY: 1 Daughter - No Family History 1 son - No Family History   SOCIAL HISTORY: Marital Status: Married Preferred Language: English; Race: White Current Smoking Status: Patient has never smoked.   Tobacco Use Assessment  Completed: Used Tobacco in last 30 days? Has never drank.  Drinks 1 caffeinated drink per day.    REVIEW OF SYSTEMS:    GU Review Male:   Patient reports frequent urination and hard to postpone urination. Patient denies burning/ pain with urination, get up at night to urinate, leakage of urine, stream starts and stops, trouble starting your stream, have to strain to urinate , erection problems, and penile pain.  Gastrointestinal (Upper):   Patient reports nausea. Patient denies vomiting and indigestion/ heartburn.  Gastrointestinal (Lower):   Patient denies diarrhea and constipation.  Constitutional:   Patient denies fever, night sweats, weight loss, and fatigue.  Skin:   Patient denies skin rash/ lesion and itching.  Eyes:   Patient denies blurred vision and double vision.  Ears/ Nose/ Throat:   Patient denies sinus problems and sore throat.  Hematologic/Lymphatic:   Patient denies swollen glands and easy bruising.  Cardiovascular:   Patient denies leg swelling and chest pains.  Respiratory:   Patient denies cough and shortness of breath.  Endocrine:   Patient denies excessive thirst.  Musculoskeletal:   Patient denies back pain and joint pain.  Neurological:   Patient denies headaches and dizziness.  Psychologic:   Patient denies depression and anxiety.   Notes: left lower abdomen kidney stone pain    VITAL SIGNS:      05/14/2022 10:28 AM  Weight 260 lb / 117.93 kg  Height 79 in / 200.66 cm  BP 124/73 mmHg  Heart Rate 53 /min  Temperature 98.2 F / 36.7 C  BMI 29.3 kg/m   MULTI-SYSTEM PHYSICAL EXAMINATION:    Constitutional: Well-nourished. No physical deformities. Normally developed. Good grooming.  Neck: Neck symmetrical, not swollen. Normal tracheal position.  Respiratory: No labored breathing, no use of accessory muscles.   Cardiovascular: Normal temperature, normal extremity pulses, no swelling, no varicosities.  Skin: No paleness, no jaundice, no cyanosis. No lesion, no  ulcer, no rash.  Neurologic / Psychiatric: Oriented to time, oriented to place, oriented to person. No depression, no anxiety, no agitation.  Gastrointestinal: No mass, no tenderness, no rigidity, non obese abdomen.  Musculoskeletal: Normal gait and station of head and neck.     Complexity of Data:  Source Of History:  Patient, Medical Record Summary  Lab Test Review:   Stone Analysis  Records Review:   Previous Doctor Records, Previous Hospital Records, Previous Patient Records  Urine Test Review:   Urinalysis, Urine Culture, 24 Hour Urine  X-Ray Review: C.T. Abdomen/Pelvis: Reviewed Films. Reviewed Report. Discussed With Patient.     05/14/22  Urinalysis  Urine Appearance Clear   Urine Color Yellow   Urine Glucose Neg mg/dL  Urine Bilirubin Neg mg/dL  Urine Ketones Neg mg/dL  Urine Specific Gravity 1.020   Urine Blood 2+ ery/uL  Urine pH 7.0   Urine Protein Neg mg/dL  Urine Urobilinogen 0.2 mg/dL  Urine Nitrites Neg   Urine Leukocyte Esterase Neg leu/uL  Urine WBC/hpf 0 - 5/hpf   Urine RBC/hpf 3 - 10/hpf   Urine Epithelial Cells NS (Not Seen)   Urine Bacteria Rare (0-9/hpf)   Urine Mucous Present   Urine Yeast NS (Not Seen)   Urine Trichomonas Not Present   Urine Cystals NS (Not Seen)   Urine Casts NS (Not Seen)   Urine Sperm Not Present    PROCEDURES:          Urinalysis w/Scope Dipstick Dipstick Cont'd Micro  Color: Yellow Bilirubin: Neg mg/dL WBC/hpf: 0 - 5/hpf  Appearance: Clear Ketones: Neg mg/dL RBC/hpf: 3 - 38/HWE  Specific Gravity: 1.020 Blood: 2+ ery/uL Bacteria: Rare (0-9/hpf)  pH: 7.0 Protein: Neg mg/dL Cystals: NS (Not Seen)  Glucose: Neg mg/dL Urobilinogen: 0.2 mg/dL Casts: NS (Not Seen)    Nitrites: Neg Trichomonas: Not Present    Leukocyte Esterase: Neg leu/uL Mucous: Present      Epithelial Cells: NS (Not Seen)      Yeast: NS (Not Seen)      Sperm: Not Present    ASSESSMENT:      ICD-10 Details  1 GU:   Ureteral calculus - N20.1 Left,  Undiagnosed New Problem   PLAN:            Medications New Meds: Ketorolac Tromethamine 10 mg tablet 1 tablet PO Q 8 H PRN   #20  0 Refill(s)  Oxycodone-Acetaminophen 5 mg-325 mg tablet 1 tablet PO Q 6 H PRN   #10  0 Refill(s)  Pharmacy Name:  [facility]  Address:  [address]   [place], [place] [address]  Phone:  [phone]  Fax:  [phone]            Orders Labs  Urine Culture          Schedule Return Visit/Planned Activity: Next Available Appointment - Schedule Surgery          Document Letter(s):  Created for Patient: Clinical Summary         Notes:   Patient with symptomatic left proximal ureteral calculi, easily seen on CT scout imaging. Pain currently controlled but still symptomatic to some degree. Nontoxic in appearance, no acute distress. Historically we have treated his obstructing stones with shockwave lithotripsy. He would like to have this performed again if Dr. Liliane ShiWinter is agreeable. I will send a message and if he in agreement and patient will be contacted and scheduled appropriately. He will continue tamsulosin. He will continue to stay well hydrated and nourished monitoring for interval stone material passage which has happened before even with a stone of similar size. He has antinausea medication waiting for him at his pharmacy. Given the fact that he did not receive only a few tablets of pain medication from the ED, I will refill both Percocet and Toradol for him today. He understands ED follow-up instructions for poorly controlled or worsening symptomology symptomology over the weekend. I sent a precautionary urine culture today.   **For shockwave lithotripsy I described the risks which include arrhythmia, kidney contusion, kidney hemorrhage, need for transfusion, long-term risk of diabetes or hypertension, back discomfort, flank ecchymosis, flank abrasion, inability to break up stone, inability to pass stone fragments, Steinstrasse, infection associated with  obstructing stones, need for different surgical procedure and possible need for repeat shockwave lithotripsy.

## 2022-05-25 NOTE — Progress Notes (Signed)
Talked with patient. Hx and meds reviewed. Instructions given. NPO after MN. Arrival time  79. Mom is the driver.

## 2022-05-27 ENCOUNTER — Encounter (HOSPITAL_BASED_OUTPATIENT_CLINIC_OR_DEPARTMENT_OTHER)
Admission: RE | Disposition: A | Discharge status: Home / Self Care | Payer: Self-pay | Source: Home / Self Care | Attending: Urology

## 2022-05-27 ENCOUNTER — Ambulatory Visit (HOSPITAL_BASED_OUTPATIENT_CLINIC_OR_DEPARTMENT_OTHER)
Admission: RE | Admit: 2022-05-27 | Discharge: 2022-05-27 | Disposition: A | Discharge status: Home / Self Care | Payer: BC Managed Care – PPO | Attending: Urology | Admitting: Urology

## 2022-05-27 ENCOUNTER — Encounter (HOSPITAL_BASED_OUTPATIENT_CLINIC_OR_DEPARTMENT_OTHER): Payer: Self-pay | Admitting: Urology

## 2022-05-27 ENCOUNTER — Ambulatory Visit (HOSPITAL_COMMUNITY): Payer: BC Managed Care – PPO

## 2022-05-27 DIAGNOSIS — N201 Calculus of ureter: Secondary | ICD-10-CM | POA: Insufficient documentation

## 2022-05-27 HISTORY — PX: EXTRACORPOREAL SHOCK WAVE LITHOTRIPSY: SHX1557

## 2022-05-27 SURGERY — LITHOTRIPSY, ESWL
Anesthesia: LOCAL | Laterality: Left

## 2022-05-27 MED ORDER — OXYCODONE HCL 5 MG PO TABS: 5.0000 mg | ORAL_TABLET | Freq: Three times a day (TID) | ORAL | 0 refills | Status: AC | PRN

## 2022-05-27 MED ORDER — DIPHENHYDRAMINE HCL 25 MG PO CAPS
25.0000 mg | ORAL_CAPSULE | ORAL | Status: AC
  Administered 2022-05-27: 25 mg via ORAL

## 2022-05-27 MED ORDER — CIPROFLOXACIN HCL 500 MG PO TABS
500.0000 mg | ORAL_TABLET | ORAL | Status: AC
  Administered 2022-05-27: 500 mg via ORAL

## 2022-05-27 MED ORDER — DIPHENHYDRAMINE HCL 25 MG PO CAPS
ORAL_CAPSULE | ORAL | Status: AC
  Filled 2022-05-27: qty 1

## 2022-05-27 MED ORDER — CIPROFLOXACIN HCL 500 MG PO TABS
ORAL_TABLET | ORAL | Status: AC
  Filled 2022-05-27: qty 1

## 2022-05-27 MED ORDER — SODIUM CHLORIDE 0.9 % IV SOLN: INTRAVENOUS | Status: DC

## 2022-05-27 MED ORDER — DIAZEPAM 5 MG PO TABS
10.0000 mg | ORAL_TABLET | ORAL | Status: AC
  Administered 2022-05-27: 10 mg via ORAL

## 2022-05-27 MED ORDER — KETOROLAC TROMETHAMINE 10 MG PO TABS: 10.0000 mg | ORAL_TABLET | Freq: Four times a day (QID) | ORAL | 0 refills | Status: AC | PRN

## 2022-05-27 MED ORDER — DIAZEPAM 5 MG PO TABS
ORAL_TABLET | ORAL | Status: AC
  Filled 2022-05-27: qty 2

## 2022-05-27 MED ORDER — TAMSULOSIN HCL 0.4 MG PO CAPS: 0.4000 mg | ORAL_CAPSULE | Freq: Every day | ORAL | 0 refills | Status: AC

## 2022-05-27 NOTE — Op Note (Signed)
See [facility] OP note scanned into chart. Also because of the size, density, location and other factors that cannot be anticipated I feel this will likely be a staged procedure. This fact supersedes any indication in the scanned [facility] operative note to the contrary.  

## 2022-05-27 NOTE — Discharge Instructions (Signed)
See [facility] discharge instructions in chart.  

## 2022-05-28 ENCOUNTER — Encounter (HOSPITAL_BASED_OUTPATIENT_CLINIC_OR_DEPARTMENT_OTHER): Payer: Self-pay | Admitting: Urology
